# Patient Record
Sex: Female | Born: 1990 | Race: Black or African American | Hispanic: No | Marital: Single | Smoking: Former smoker
Health system: Southern US, Community
[De-identification: ages and names within clinical notes are randomized; demographics above are authoritative.]

## PROBLEM LIST (undated history)

## (undated) DIAGNOSIS — M25519 Pain in unspecified shoulder: Secondary | ICD-10-CM

## (undated) DIAGNOSIS — M549 Dorsalgia, unspecified: Secondary | ICD-10-CM

## (undated) DIAGNOSIS — G8929 Other chronic pain: Secondary | ICD-10-CM

## (undated) HISTORY — PX: CYST EXCISION: SHX5701

---
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
## 2012-01-12 ENCOUNTER — Encounter (HOSPITAL_COMMUNITY): Payer: Self-pay | Admitting: Emergency Medicine

## 2012-01-12 ENCOUNTER — Emergency Department (HOSPITAL_COMMUNITY)
Admission: EM | Admit: 2012-01-12 | Discharge: 2012-01-12 | Disposition: A | Discharge status: Home / Self Care | Payer: BC Managed Care – PPO | Attending: Emergency Medicine | Admitting: Emergency Medicine

## 2012-01-12 DIAGNOSIS — R51 Headache: Secondary | ICD-10-CM | POA: Insufficient documentation

## 2012-01-12 DIAGNOSIS — H53149 Visual discomfort, unspecified: Secondary | ICD-10-CM | POA: Insufficient documentation

## 2012-01-12 DIAGNOSIS — R42 Dizziness and giddiness: Secondary | ICD-10-CM | POA: Insufficient documentation

## 2012-01-12 LAB — URINALYSIS, ROUTINE W REFLEX MICROSCOPIC
Hgb urine dipstick: NEGATIVE
Nitrite: NEGATIVE
Specific Gravity, Urine: 1.02 (ref 1.005–1.030)
Urobilinogen, UA: 0.2 mg/dL (ref 0.0–1.0)
pH: 7 (ref 5.0–8.0)

## 2012-01-12 MED ORDER — IBUPROFEN 800 MG PO TABS
800.0000 mg | ORAL_TABLET | Freq: Once | ORAL | Status: AC
  Administered 2012-01-12: 800 mg via ORAL
  Filled 2012-01-12: qty 1

## 2012-01-12 MED ORDER — SUMATRIPTAN SUCCINATE 6 MG/0.5ML ~~LOC~~ SOLN
6.0000 mg | Freq: Once | SUBCUTANEOUS | Status: AC
  Administered 2012-01-12: 6 mg via SUBCUTANEOUS
  Filled 2012-01-12: qty 0.5

## 2012-01-12 MED ORDER — DIPHENHYDRAMINE HCL 25 MG PO CAPS
50.0000 mg | ORAL_CAPSULE | Freq: Once | ORAL | Status: AC
  Administered 2012-01-12: 50 mg via ORAL
  Filled 2012-01-12: qty 2

## 2012-01-12 MED ORDER — ISOMETHEPTENE-APAP-DICHLORAL 65-325-100 MG PO CAPS: 1.0000 | ORAL_CAPSULE | Freq: Four times a day (QID) | ORAL | Status: DC | PRN

## 2012-01-12 MED ORDER — METOCLOPRAMIDE HCL 10 MG PO TABS
10.0000 mg | ORAL_TABLET | Freq: Once | ORAL | Status: AC
  Administered 2012-01-12: 10 mg via ORAL
  Filled 2012-01-12: qty 1

## 2012-01-12 MED ORDER — PROCHLORPERAZINE EDISYLATE 5 MG/ML IJ SOLN
10.0000 mg | Freq: Once | INTRAMUSCULAR | Status: DC
  Filled 2012-01-12: qty 2
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
## 2012-01-12 NOTE — ED Notes (Signed)
Pt alert & oriented x4, stable gait. Pt given discharge instructions, paperwork & prescription(s). Patient instructed to stop at the registration desk to finish any additional paperwork. pt verbalized understanding. Pt left department w/ no further questions.  

## 2012-01-12 NOTE — Discharge Instructions (Signed)
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
## 2012-01-16 NOTE — ED Provider Notes (Signed)
Medical screening examination/treatment/procedure(s) were performed by non-physician practitioner and as supervising physician I was immediately available for consultation/collaboration.   Shelda Jakes, MD 01/16/12 [phone]

## 2012-04-24 ENCOUNTER — Encounter (HOSPITAL_COMMUNITY): Payer: Self-pay

## 2012-04-24 ENCOUNTER — Emergency Department (HOSPITAL_COMMUNITY)
Admission: EM | Admit: 2012-04-24 | Discharge: 2012-04-24 | Disposition: A | Discharge status: Home / Self Care | Payer: BC Managed Care – PPO | Attending: Emergency Medicine | Admitting: Emergency Medicine

## 2012-04-24 DIAGNOSIS — Z77098 Contact with and (suspected) exposure to other hazardous, chiefly nonmedicinal, chemicals: Secondary | ICD-10-CM | POA: Insufficient documentation

## 2012-04-24 DIAGNOSIS — H5789 Other specified disorders of eye and adnexa: Secondary | ICD-10-CM

## 2012-04-24 DIAGNOSIS — H5710 Ocular pain, unspecified eye: Secondary | ICD-10-CM | POA: Insufficient documentation

## 2012-04-24 NOTE — ED Notes (Signed)
Pt reports got some spray silicone in left eye approx ago.  Pt says rinsed her eye with water for approx 10 min.

## 2012-04-24 NOTE — ED Notes (Signed)
Pt's left eye is 20/25, right eye is 20/20

## 2012-04-24 NOTE — ED Provider Notes (Signed)
History     CSN: [id number]  Arrival date & time 04/24/12  1743   First MD Initiated Contact with Patient 04/24/12 1756      Chief Complaint  Patient presents with  . Eye Pain    (Consider location/radiation/quality/duration/timing/severity/associated sxs/prior treatment) HPI Comments: Patient comes to ED requesting evaluation of her left eye after she got OTC silicone spray in her left eye.  States that she flushed her eye for approx 10 minutes with water and states he eye is feeling much better, but states she "wanted to make sure it was okay".  States pain has improved but her eye now "feels dry".  She denies headaches, dizziness, or visual changes  Patient is a 21 y.o. female presenting with eye problem. The history is provided by the patient.  Eye Problem  This is a new problem. The current episode started less than 1 hour ago. The problem occurs constantly. The problem has been rapidly improving. There is pain in the left eye. The injury mechanism was a chemical exposure. The pain is mild. There is no history of trauma to the eye. There is no known exposure to pink eye. She does not wear contacts. Associated symptoms include eye redness. Pertinent negatives include no numbness, no blurred vision, no decreased vision, no discharge, no double vision, no foreign body sensation, no photophobia, no nausea, no vomiting, no tingling, no weakness and no itching. She has tried water for the symptoms. The treatment provided significant relief.    History reviewed. No pertinent past medical history.  History reviewed. No pertinent past surgical history.  No family history on file.  History  Substance Use Topics  . Smoking status: Never Smoker   . Smokeless tobacco: Not on file  . Alcohol Use: No    OB History    Grav Para Term Preterm Abortions TAB SAB Ect Mult Living                  Review of Systems  Constitutional: Negative for fever.  HENT: Negative for facial swelling.     Eyes: Positive for pain and redness. Negative for blurred vision, double vision, photophobia, discharge, itching and visual disturbance.  Gastrointestinal: Negative for nausea and vomiting.  Skin: Negative for itching.  Neurological: Negative for dizziness, tingling, weakness, light-headedness, numbness and headaches.  Psychiatric/Behavioral: Negative for confusion and decreased concentration.  All other systems reviewed and are negative.    Allergies  Review of patient's allergies indicates no known allergies.  Home Medications   Current Outpatient Rx  Name Route Sig Dispense Refill  . ISOMETHEPTENE-APAP-DICHLORAL 65-325-100 MG PO CAPS Oral Take 1 capsule by mouth 4 (four) times daily as needed. 30 capsule 0    BP 121/82  Pulse 75  Temp 98.8 F (37.1 C) (Oral)  Resp 18  Ht 5\' 6"  (1.676 m)  Wt 129 lb (58.514 kg)  BMI 20.82 kg/m2  SpO2 100%  LMP 04/10/2012  Physical Exam  Nursing note and vitals reviewed. Constitutional: She is oriented to person, place, and time. She appears well-developed and well-nourished. No distress.  HENT:  Head: Normocephalic and atraumatic.  Mouth/Throat: Oropharynx is clear and moist.  Eyes: EOM and lids are normal. Pupils are equal, round, and reactive to light. No foreign bodies found. Right eye exhibits no chemosis and no discharge. Left eye exhibits no chemosis and no discharge. Right conjunctiva is not injected. Right conjunctiva has no hemorrhage. Left conjunctiva is not injected. Left conjunctiva has no hemorrhage. Right eye exhibits  normal extraocular motion. Left eye exhibits normal extraocular motion.  Neck: Normal range of motion. Neck supple.  Cardiovascular: Normal rate, regular rhythm, normal heart sounds and intact distal pulses.   No murmur heard. Pulmonary/Chest: Effort normal and breath sounds normal. No respiratory distress.  Musculoskeletal: Normal range of motion.  Lymphadenopathy:    She has no cervical adenopathy.   Neurological: She is alert and oriented to person, place, and time. She exhibits normal muscle tone. Coordination normal.  Skin: Skin is warm and dry.    ED Course  Procedures (including critical care time)  Labs Reviewed - No data to display    Visual acuity reviewed by me.    MDM    Left eye was irrigated with 150 mL of nml saline.  Visual acuity is wnml.     Pt is feeling better.  MSDS sheet was reviewed by me.  Does not wear contacts.  Pt agrees to follow-up with Dr. Lita Mains.  Advised to use natural tears.    The patient appears reasonably screened and/or stabilized for discharge and I doubt any other medical condition or other [facility] requiring further screening, evaluation, or treatment in the ED at this time prior to discharge.   Bo Teicher L. [place], [place] 04/26/12 1946

## 2012-04-24 NOTE — ED Notes (Signed)
Flushed left eye with approx NSS.  Pt tolerated well.

## 2012-04-27 NOTE — ED Provider Notes (Signed)
Medical screening examination/treatment/procedure(s) were performed by non-physician practitioner and as supervising physician I was immediately available for consultation/collaboration.  Doug Sou, MD 04/27/12 [phone]

## 2012-06-12 ENCOUNTER — Encounter (HOSPITAL_COMMUNITY): Payer: Self-pay | Admitting: *Deleted

## 2012-06-12 ENCOUNTER — Emergency Department (HOSPITAL_COMMUNITY)
Admission: EM | Admit: 2012-06-12 | Discharge: 2012-06-12 | Disposition: A | Discharge status: Home / Self Care | Payer: BC Managed Care – PPO | Attending: Emergency Medicine | Admitting: Emergency Medicine

## 2012-06-12 ENCOUNTER — Emergency Department (HOSPITAL_COMMUNITY): Payer: BC Managed Care – PPO

## 2012-06-12 DIAGNOSIS — J069 Acute upper respiratory infection, unspecified: Secondary | ICD-10-CM | POA: Insufficient documentation

## 2012-06-12 LAB — URINALYSIS, ROUTINE W REFLEX MICROSCOPIC
Bilirubin Urine: NEGATIVE
Glucose, UA: NEGATIVE mg/dL
Hgb urine dipstick: NEGATIVE
Specific Gravity, Urine: 1.015 (ref 1.005–1.030)
Urobilinogen, UA: 1 mg/dL (ref 0.0–1.0)

## 2012-06-12 LAB — PREGNANCY, URINE: Preg Test, Ur: NEGATIVE

## 2012-06-12 MED ORDER — IBUPROFEN 800 MG PO TABS
800.0000 mg | ORAL_TABLET | Freq: Once | ORAL | Status: AC
  Administered 2012-06-12: 800 mg via ORAL
  Filled 2012-06-12: qty 1

## 2012-06-12 NOTE — ED Provider Notes (Signed)
History     CSN: [id number]  Arrival date & time 06/12/12  1530   First MD Initiated Contact with Patient 06/12/12 1852      No chief complaint on file.   (Consider location/radiation/quality/duration/timing/severity/associated sxs/prior treatment) HPI Comments: Patient presents with 3 days of body aches, headache, sore throat, nausea and vomiting and generalized weakness. She also has nonproductive cough and rhinorrhea. She has multiple sick contacts. Denies fever. No chest pain, shortness of breath, abdominal pain. She feels sore and achy all over and not noticed any rashes. Good by mouth intake and urine output.  The history is provided by the patient.    History reviewed. No pertinent past medical history.  History reviewed. No pertinent past surgical history.  History reviewed. No pertinent family history.  History  Substance Use Topics  . Smoking status: Never Smoker   . Smokeless tobacco: Not on file  . Alcohol Use: No    OB History    Grav Para Term Preterm Abortions TAB SAB Ect Mult Living                  Review of Systems  Constitutional: Positive for activity change, appetite change and fatigue. Negative for fever.  HENT: Positive for congestion, sore throat and rhinorrhea. Negative for neck pain and neck stiffness.   Eyes: Negative for visual disturbance.  Respiratory: Negative for cough and chest tightness.   Cardiovascular: Negative for chest pain.  Gastrointestinal: Positive for nausea. Negative for vomiting and abdominal pain.  Genitourinary: Negative for dysuria and hematuria.  Musculoskeletal: Positive for myalgias and arthralgias. Negative for back pain.  Skin: Negative for rash.  Neurological: Positive for weakness and headaches. Negative for dizziness.    Allergies  Review of patient's allergies indicates no known allergies.  Home Medications   Current Outpatient Rx  Name Route Sig Dispense Refill  . PSEUDOEPH-DOXYLAMINE-DM-APAP  60-7.02-01-999 MG/30ML PO LIQD Oral Take 15 mLs by mouth once as needed. For  Cold and flu symptoms      BP 119/69  Pulse 88  Temp 99.2 F (37.3 C) (Oral)  Resp 18  Ht 5\' 6"  (1.676 m)  Wt 127 lb (57.607 kg)  BMI 20.50 kg/m2  SpO2 100%  LMP 06/09/2012  Physical Exam  Constitutional: She is oriented to person, place, and time. She appears well-developed and well-nourished. No distress.       nontoxic  HENT:  Head: Normocephalic and atraumatic.  Mouth/Throat: Oropharynx is clear and moist. No oropharyngeal exudate.  Eyes: Conjunctivae normal and EOM are normal. Pupils are equal, round, and reactive to light.  Neck: Normal range of motion. Neck supple.       No meningismus  Cardiovascular: Normal rate, regular rhythm and normal heart sounds.   No murmur heard. Pulmonary/Chest: Effort normal and breath sounds normal. No respiratory distress.  Abdominal: Soft. Bowel sounds are normal. There is no tenderness. There is no rebound and no guarding.  Musculoskeletal: Normal range of motion. She exhibits no edema and no tenderness.  Neurological: She is alert and oriented to person, place, and time. No cranial nerve deficit.  Skin: Skin is warm.    ED Course  Procedures (including critical care time)  Labs Reviewed  URINALYSIS, ROUTINE W REFLEX MICROSCOPIC - Abnormal; Notable for the following:    APPearance HAZY (*)     All other components within normal limits  PREGNANCY, URINE  RAPID STREP SCREEN   Dg Chest 2 View  06/12/2012  *RADIOLOGY REPORT*  Clinical Data:  Cough  CHEST - 2 VIEW  Comparison: 05/19/2008.  Findings: The heart, mediastinal and hilar contours are normal. The lungs are clear and fully expanded.  There are no effusions or pneumothoraces.  There is mild dextroscoliosis.  The bony thorax is normal.  IMPRESSION: Normal chest.   Original Report Authenticated By: Mervin Hack, M.D.      1. Upper respiratory infection       MDM  Bodyaches, headache, sore  throat, congestion, nausea.  Vitals stable, afebrile, nontoxic.  CXR negative.  No meningismus, normal neuro exam.  Supportive care for URI.     Glynn Octave, MD 06/12/12 2124

## 2012-06-12 NOTE — ED Notes (Signed)
Sore throat, vomiting, headache, weakness.

## 2012-06-12 NOTE — ED Notes (Addendum)
Pt reporting generalized aches and morning nausea x2-3 days.  States she vomits "just a little".  Also reporting headache and mild cough. No distress noted at present time.

## 2012-07-08 ENCOUNTER — Emergency Department (HOSPITAL_COMMUNITY)
Admission: EM | Admit: 2012-07-08 | Discharge: 2012-07-08 | Disposition: A | Discharge status: Home / Self Care | Payer: BC Managed Care – PPO | Attending: Emergency Medicine | Admitting: Emergency Medicine

## 2012-07-08 ENCOUNTER — Encounter (HOSPITAL_COMMUNITY): Payer: Self-pay | Admitting: *Deleted

## 2012-07-08 DIAGNOSIS — M549 Dorsalgia, unspecified: Secondary | ICD-10-CM | POA: Insufficient documentation

## 2012-07-08 DIAGNOSIS — G8929 Other chronic pain: Secondary | ICD-10-CM | POA: Insufficient documentation

## 2012-07-08 HISTORY — DX: Pain in unspecified shoulder: M25.519

## 2012-07-08 HISTORY — DX: Other chronic pain: G89.29

## 2012-07-08 HISTORY — DX: Dorsalgia, unspecified: M54.9

## 2012-07-08 MED ORDER — TRAMADOL HCL 50 MG PO TABS: 50.0000 mg | ORAL_TABLET | Freq: Four times a day (QID) | ORAL | Status: DC | PRN

## 2012-07-08 MED ORDER — CYCLOBENZAPRINE HCL 10 MG PO TABS
10.0000 mg | ORAL_TABLET | Freq: Once | ORAL | Status: AC
  Administered 2012-07-08: 10 mg via ORAL
  Filled 2012-07-08: qty 1

## 2012-07-08 MED ORDER — TRAMADOL HCL 50 MG PO TABS
50.0000 mg | ORAL_TABLET | Freq: Once | ORAL | Status: AC
  Administered 2012-07-08: 50 mg via ORAL
  Filled 2012-07-08: qty 1
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
## 2012-07-08 NOTE — ED Notes (Signed)
Pt discharged. Pt stable at time of discharge. Medications reviewed pt has no questions regarding discharge at this time. Pt voiced understanding of discharge instructions.  
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
## 2012-09-25 ENCOUNTER — Encounter (HOSPITAL_COMMUNITY): Payer: Self-pay

## 2012-09-25 ENCOUNTER — Emergency Department (HOSPITAL_COMMUNITY): Payer: BC Managed Care – PPO

## 2012-09-25 ENCOUNTER — Emergency Department (HOSPITAL_COMMUNITY)
Admission: EM | Admit: 2012-09-25 | Discharge: 2012-09-25 | Disposition: A | Discharge status: Home / Self Care | Payer: BC Managed Care – PPO | Attending: Emergency Medicine | Admitting: Emergency Medicine

## 2012-09-25 DIAGNOSIS — R0789 Other chest pain: Secondary | ICD-10-CM | POA: Insufficient documentation

## 2012-09-25 DIAGNOSIS — Z8679 Personal history of other diseases of the circulatory system: Secondary | ICD-10-CM | POA: Insufficient documentation

## 2012-09-25 DIAGNOSIS — G8929 Other chronic pain: Secondary | ICD-10-CM | POA: Insufficient documentation

## 2012-09-25 DIAGNOSIS — F17200 Nicotine dependence, unspecified, uncomplicated: Secondary | ICD-10-CM | POA: Insufficient documentation

## 2012-09-25 MED ORDER — IBUPROFEN 800 MG PO TABS: 800.0000 mg | ORAL_TABLET | Freq: Three times a day (TID) | ORAL | Status: DC

## 2012-09-25 MED ORDER — IBUPROFEN 800 MG PO TABS
800.0000 mg | ORAL_TABLET | Freq: Once | ORAL | Status: AC
  Administered 2012-09-25: 800 mg via ORAL
  Filled 2012-09-25: qty 1

## 2012-09-25 NOTE — ED Notes (Signed)
Sharp stabbing pains in center of chest that started several days ago, intermittent, lasts a minute or so, coming every 20-30 minutes today, no assoc symptoms

## 2012-09-25 NOTE — ED Notes (Signed)
Discharge instructions given and reviewed with patient.  Prescription given for Motrin; effects and use explained.  Patient verbalized understanding to take Motrin as directed and to follow up with PMD as needed.  Patient ambulatory; discharged home in good condition.
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
## 2013-04-04 ENCOUNTER — Emergency Department (HOSPITAL_COMMUNITY)
Admission: EM | Admit: 2013-04-04 | Discharge: 2013-04-04 | Disposition: A | Discharge status: Home / Self Care | Payer: BC Managed Care – PPO | Attending: Emergency Medicine | Admitting: Emergency Medicine

## 2013-04-04 ENCOUNTER — Encounter (HOSPITAL_COMMUNITY): Payer: Self-pay

## 2013-04-04 DIAGNOSIS — M26609 Unspecified temporomandibular joint disorder, unspecified side: Secondary | ICD-10-CM | POA: Insufficient documentation

## 2013-04-04 DIAGNOSIS — F17200 Nicotine dependence, unspecified, uncomplicated: Secondary | ICD-10-CM | POA: Insufficient documentation

## 2013-04-04 DIAGNOSIS — G8929 Other chronic pain: Secondary | ICD-10-CM | POA: Insufficient documentation

## 2013-04-04 MED ORDER — NAPROXEN 500 MG PO TABS: 500.0000 mg | ORAL_TABLET | Freq: Two times a day (BID) | ORAL | Status: DC

## 2013-04-04 NOTE — ED Notes (Signed)
Pt c/o pain in R jaw x 3 days.  Reports pain is worse with movement.  Reports jaw making a "popping" noise.

## 2013-04-04 NOTE — ED Provider Notes (Signed)
CSN: [id number]     Arrival date & time 04/04/13  0718 History    This chart was scribed for Charles B. Bernette Mayers, MD by Leone Payor, ED Scribe. This patient was seen in room APA11/APA11 and the patient's care was started 8:02 AM.   First MD Initiated Contact with Patient 04/04/13 0800     Chief Complaint  Patient presents with  . Jaw Pain    The history is provided by the patient. No language interpreter was used.    HPI Comments: Audrey Reid is a 22 y.o. female who presents to the Emergency Department complaining of ongoing, constant right jaw pain starting 3 days ago. Pt states pain is worse with movement. Reports the jaw makes a popping noise. Pt denies frequent gum chewing. Denies fever, cough.   Past Medical History  Diagnosis Date  . Chronic shoulder pain   . Chronic back pain    History reviewed. No pertinent past surgical history. No family history on file. History  Substance Use Topics  . Smoking status: Current Every Day Smoker  . Smokeless tobacco: Not on file  . Alcohol Use: No   OB History   Grav Para Term Preterm Abortions TAB SAB Ect Mult Living                 Review of Systems  Constitutional: Negative for fever.  HENT: Negative for dental problem.        Jaw pain  Respiratory: Negative for cough.     Allergies  Review of patient's allergies indicates no known allergies.  Home Medications   Current Outpatient Rx  Name  Route  Sig  Dispense  Refill  . ibuprofen (ADVIL,MOTRIN) 800 MG tablet   Oral   Take 1 tablet (800 mg total) by mouth 3 (three) times daily.   21 tablet   0   . Pseudoeph-Doxylamine-DM-APAP (NYQUIL) 60-7.02-01-999 MG/30ML LIQD   Oral   Take 15 mLs by mouth once as needed. For  Cold and flu symptoms         . traMADol (ULTRAM) 50 MG tablet   Oral   Take 1 tablet (50 mg total) by mouth every 6 (six) hours as needed for pain.   15 tablet   0    BP 130/77  Pulse 60  Temp(Src) 98.4 F (36.9 C) (Oral)  Resp 18  Ht  5\' 6"  (1.676 m)  Wt 135 lb (61.236 kg)  BMI 21.8 kg/m2  SpO2 100%  LMP 03/28/2013 Physical Exam  Nursing note and vitals reviewed. Constitutional: She is oriented to person, place, and time. She appears well-developed and well-nourished.  HENT:  Head: Normocephalic and atraumatic.  Tender right TMJ.   Eyes: EOM are normal. Pupils are equal, round, and reactive to light.  Neck: Normal range of motion. Neck supple.  Cardiovascular: Normal rate, normal heart sounds and intact distal pulses.   Pulmonary/Chest: Effort normal and breath sounds normal.  Abdominal: Bowel sounds are normal. She exhibits no distension. There is no tenderness.  Musculoskeletal: Normal range of motion. She exhibits no edema and no tenderness.  Neurological: She is alert and oriented to person, place, and time. She has normal strength. No cranial nerve deficit or sensory deficit.  Skin: Skin is warm and dry. No rash noted.  Psychiatric: She has a normal mood and affect.    ED Course   Procedures (including critical care time)  DIAGNOSTIC STUDIES: Oxygen Saturation is 100% on RA, normal by my interpretation.  COORDINATION OF CARE: 8:01 AM Discussed treatment plan with pt at bedside and pt agreed to plan.   Labs Reviewed - No data to display No results found. 1. TMJ (temporomandibular joint syndrome)     MDM  Pt with benign uncomplicated TMJ syndrome. Advised NSAIDs, soft diet and PCP followup for recheck if worsening.   I personally performed the services described in this documentation, which was scribed in my presence. The recorded information has been reviewed and is accurate.     Charles B. Bernette Mayers, MD 04/04/13 1322

## 2014-05-13 ENCOUNTER — Encounter (HOSPITAL_COMMUNITY): Payer: Self-pay | Admitting: Emergency Medicine

## 2014-05-13 ENCOUNTER — Emergency Department (HOSPITAL_COMMUNITY)
Admission: EM | Admit: 2014-05-13 | Discharge: 2014-05-13 | Disposition: A | Discharge status: Home / Self Care | Payer: BC Managed Care – PPO | Attending: Emergency Medicine | Admitting: Emergency Medicine

## 2014-05-13 DIAGNOSIS — Z87891 Personal history of nicotine dependence: Secondary | ICD-10-CM | POA: Insufficient documentation

## 2014-05-13 DIAGNOSIS — G8929 Other chronic pain: Secondary | ICD-10-CM | POA: Diagnosis not present

## 2014-05-13 DIAGNOSIS — J029 Acute pharyngitis, unspecified: Secondary | ICD-10-CM | POA: Diagnosis present

## 2014-05-13 LAB — RAPID STREP SCREEN (MED CTR MEBANE ONLY): Streptococcus, Group A Screen (Direct): NEGATIVE

## 2014-05-13 NOTE — ED Provider Notes (Signed)
CSN: [id number]     Arrival date & time 05/13/14  1613 History   First MD Initiated Contact with Patient 05/13/14 1628     Chief Complaint  Patient presents with  . Sore Throat     (Consider location/radiation/quality/duration/timing/severity/associated sxs/prior Treatment) Patient is a 23 y.o. female presenting with pharyngitis. The history is provided by the patient.  Sore Throat This is a new problem. The current episode started in the past 7 days. The problem occurs constantly. The problem has been gradually worsening. The symptoms are aggravated by swallowing. She has tried nothing for the symptoms.   Audrey Reid is a 23 y.o. female who presents to the ED with sore throat. She denies fever or chills. The patient states that she has been having oral sex with several different partners and they have had sex with other people as well. She is concerned that she may have an STI. She request testing for STI's. Patient has had oral and vaginal sex.   Past Medical History  Diagnosis Date  . Chronic shoulder pain   . Chronic back pain    History reviewed. No pertinent past surgical history. History reviewed. No pertinent family history. History  Substance Use Topics  . Smoking status: Former Smoker    Quit date: 05/13/2013  . Smokeless tobacco: Not on file  . Alcohol Use: No   OB History   Grav Para Term Preterm Abortions TAB SAB Ect Mult Living                 Review of Systems  negative except as stated in HPI    Allergies  Review of patient's allergies indicates no known allergies.  Home Medications   Prior to Admission medications   Not on File   BP 131/86  Pulse 74  Temp(Src) 100.4 F (38 C) (Oral)  Resp 18  Ht  (1.676 m)  Wt 153 lb (69.4 kg)  BMI 24.71 kg/m2  SpO2 100%  LMP 04/27/2014 Physical Exam  Nursing note and vitals reviewed. Constitutional: She is oriented to person, place, and time. She appears well-developed and well-nourished. No  distress.  HENT:  Head: Normocephalic and atraumatic.  Mouth/Throat: Uvula is midline and mucous membranes are normal. Posterior oropharyngeal erythema present.  Eyes: Conjunctivae and EOM are normal.  Neck: Normal range of motion. Neck supple.  Cardiovascular: Normal rate and regular rhythm.   Pulmonary/Chest: Effort normal and breath sounds normal.  Musculoskeletal: Normal range of motion.  Lymphadenopathy:    She has cervical adenopathy (mild left).  Neurological: She is alert and oriented to person, place, and time. No cranial nerve deficit.  Skin: Skin is warm and dry.  Psychiatric: She has a normal mood and affect. Her behavior is normal.    ED Course  Procedures Labs Review Rapid strep negataive Culture for GC from throat pending Syphilis and HIV pending.  MDM  23 y.o. female with sore throat x one week. Stable for discharge with negative strep screen. Discussed with the patient cultures pending and blood work pending. Encouraged patient to follow up with the health department for further screening. She agrees with plan.      [facility] Orlene Och, [place] 05/13/14 [phone]

## 2014-05-13 NOTE — ED Notes (Signed)
Pt was called and given results on rapid strep test.

## 2014-05-13 NOTE — ED Notes (Signed)
Sore throat x 1 week. Denies rash/fever/n/v. Nad.

## 2014-05-13 NOTE — ED Provider Notes (Signed)
Medical screening examination/treatment/procedure(s) were performed by non-physician practitioner and as supervising physician I was immediately available for consultation/collaboration.     Emmilyn Crooke, MD 05/13/14 1850 

## 2014-05-14 LAB — RPR

## 2014-05-14 LAB — HIV ANTIBODY (ROUTINE TESTING W REFLEX): HIV: NONREACTIVE

## 2014-05-15 LAB — CULTURE, GROUP A STREP

## 2014-05-16 LAB — GONOCOCCUS CULTURE

## 2014-06-04 ENCOUNTER — Encounter (HOSPITAL_COMMUNITY): Payer: Self-pay | Admitting: Emergency Medicine

## 2014-06-04 ENCOUNTER — Emergency Department (HOSPITAL_COMMUNITY)
Admission: EM | Admit: 2014-06-04 | Discharge: 2014-06-04 | Disposition: A | Discharge status: Home / Self Care | Payer: BC Managed Care – PPO | Attending: Emergency Medicine | Admitting: Emergency Medicine

## 2014-06-04 DIAGNOSIS — G8929 Other chronic pain: Secondary | ICD-10-CM | POA: Insufficient documentation

## 2014-06-04 DIAGNOSIS — J029 Acute pharyngitis, unspecified: Secondary | ICD-10-CM | POA: Insufficient documentation

## 2014-06-04 DIAGNOSIS — J020 Streptococcal pharyngitis: Secondary | ICD-10-CM

## 2014-06-04 DIAGNOSIS — Z87891 Personal history of nicotine dependence: Secondary | ICD-10-CM | POA: Diagnosis not present

## 2014-06-04 LAB — RAPID STREP SCREEN (MED CTR MEBANE ONLY): Streptococcus, Group A Screen (Direct): POSITIVE — AB

## 2014-06-04 MED ORDER — DEXAMETHASONE 1 MG/ML PO CONC: 10.0000 mg | Freq: Once | ORAL | Status: DC

## 2014-06-04 MED ORDER — LIDOCAINE VISCOUS 2 % MT SOLN
15.0000 mL | Freq: Once | OROMUCOSAL | Status: AC
  Administered 2014-06-04: 15 mL via OROMUCOSAL
  Filled 2014-06-04: qty 15

## 2014-06-04 MED ORDER — KETOROLAC TROMETHAMINE 30 MG/ML IJ SOLN
30.0000 mg | Freq: Once | INTRAMUSCULAR | Status: AC
  Administered 2014-06-04: 30 mg via INTRAMUSCULAR
  Filled 2014-06-04: qty 1

## 2014-06-04 MED ORDER — LIDOCAINE VISCOUS 2 % MT SOLN: 15.0000 mL | OROMUCOSAL | Status: DC | PRN

## 2014-06-04 MED ORDER — DEXAMETHASONE 10 MG/ML FOR PEDIATRIC ORAL USE
INTRAMUSCULAR | Status: AC
  Administered 2014-06-04: 10 mg via ORAL
  Filled 2014-06-04: qty 1

## 2014-06-04 MED ORDER — PENICILLIN G BENZATHINE 1200000 UNIT/2ML IM SUSP
1.2000 10*6.[IU] | Freq: Once | INTRAMUSCULAR | Status: AC
  Administered 2014-06-04: 1.2 10*6.[IU] via INTRAMUSCULAR
  Filled 2014-06-04: qty 2

## 2014-06-04 NOTE — ED Provider Notes (Signed)
CSN: [id number]     Arrival date & time 06/04/14  0715 History  This chart was scribed for Audrey Munch, MD by Jolene Provost, ED Scribe. This patient was seen in room APA19/APA19 and the patient's care was started at 7:32 AM.    Chief Complaint  Patient presents with  . Sore Throat   The history is provided by the patient. No language interpreter was used.   HPI Comments: COHEN DOLEMAN is a 23 y.o. female presents to the Emergency Department complaining of sore throat for the last three days. Pt states that it hurts to swallow, and that "her ears have been popping." Pt states that she has been coughing up sputum with specks of blood in it. Pt endorses subjective fever and chills. Pt has a past hx of ER visit, two months ago for similar symptoms. Pt received diagnostic tests that were all negative Pt used theraflu, cough drops, throat spray without relief. Pt deneis SOB, vomiting, chest pain, rashes, confusion, and hearing loss. Pt denies hx of chronic conditions.  Past Medical History  Diagnosis Date  . Chronic shoulder pain   . Chronic back pain    History reviewed. No pertinent past surgical history. No family history on file. History  Substance Use Topics  . Smoking status: Former Smoker    Quit date: 05/13/2013  . Smokeless tobacco: Not on file  . Alcohol Use: No   OB History   Grav Para Term Preterm Abortions TAB SAB Ect Mult Living                 Review of Systems  Constitutional:       Per HPI, otherwise negative  HENT:       Per HPI, otherwise negative  Respiratory:       Per HPI, otherwise negative  Cardiovascular:       Per HPI, otherwise negative  Gastrointestinal: Negative for vomiting.  Endocrine:       Negative aside from HPI  Genitourinary:       Neg aside from HPI   Musculoskeletal:       Per HPI, otherwise negative  Skin: Negative.   Neurological: Negative for syncope.      Allergies  Review of patient's allergies indicates no known  allergies.  Home Medications   Prior to Admission medications   Not on File   BP 127/98  Pulse 108  Temp(Src) 99.9 F (37.7 C)  Resp 18  SpO2 100%  LMP 05/29/2014 Physical Exam  Nursing note and vitals reviewed. Constitutional: She is oriented to person, place, and time. She appears well-developed and well-nourished. No distress.  HENT:  Head: Normocephalic and atraumatic.  Right Ear: Tympanic membrane and ear canal normal.  Left Ear: Tympanic membrane and ear canal normal.  Mouth/Throat: Uvula is midline and mucous membranes are normal. No uvula swelling. Posterior oropharyngeal erythema present. No posterior oropharyngeal edema or tonsillar abscesses.  Ears clear.  Eyes: Conjunctivae and EOM are normal.  Cardiovascular: Normal rate and regular rhythm.   Pulmonary/Chest: Effort normal and breath sounds normal. No stridor. No respiratory distress. She has no wheezes. She has no rales.  Abdominal: She exhibits no distension.  Musculoskeletal: She exhibits no edema.  Lymphadenopathy:    She has no cervical adenopathy.  Neurological: She is alert and oriented to person, place, and time. No cranial nerve deficit.  Skin: Skin is warm and dry.  Psychiatric: She has a normal mood and affect.    ED Course  Procedures (including critical care time)  DIAGNOSTIC STUDIES: Oxygen Saturation is 100% on room air, normal by my interpretation.    COORDINATION OF CARE: 7:35 AM Will order rapid strep test.   Labs Review Labs Reviewed  RAPID STREP SCREEN - Abnormal; Notable for the following:    Streptococcus, Group A Screen (Direct) POSITIVE (*)    All other components within normal limits    A review of the patient's chart demonstrates a visit earlier this month for similar concerns.  At that point patient had a negative strep test. Followup testing including HIV, RPR, gonorrhea, all negative.   8:24 AM Patient in no distress. Patient has no primary care physician, will be  provided a local resources to obtain followup care.   MDM   I personally performed the services described in this documentation, which was scribed in my presence. The recorded information has been reviewed and is accurate.   Patient presents with 3 days of throat, no evidence for distress on exam, no posterior oral pharyngeal edema, or imminent systemic compromise. Patient was treated with intramuscular penicillin for her strep pharyngitis, had improvement in her condition, was discharged in stable condition.   Audrey Munchobert Lilliann Rossetti, MD 06/04/14 [id number]

## 2014-06-04 NOTE — Discharge Instructions (Signed)

## 2014-06-04 NOTE — ED Notes (Signed)
Sore throat x 3 days. Pt also reports chills.

## 2014-08-31 ENCOUNTER — Emergency Department (HOSPITAL_COMMUNITY)
Admission: EM | Admit: 2014-08-31 | Discharge: 2014-08-31 | Discharge status: Against Medical Advice | Payer: BC Managed Care – PPO | Attending: Emergency Medicine | Admitting: Emergency Medicine

## 2014-08-31 ENCOUNTER — Encounter (HOSPITAL_COMMUNITY): Payer: Self-pay | Admitting: Emergency Medicine

## 2014-08-31 DIAGNOSIS — G8929 Other chronic pain: Secondary | ICD-10-CM | POA: Insufficient documentation

## 2014-08-31 DIAGNOSIS — R531 Weakness: Secondary | ICD-10-CM | POA: Diagnosis not present

## 2014-08-31 DIAGNOSIS — J029 Acute pharyngitis, unspecified: Secondary | ICD-10-CM | POA: Insufficient documentation

## 2014-08-31 NOTE — ED Notes (Signed)
Patient states sore throat, nasal drainage, and generalized body weakness X1 day.

## 2014-08-31 NOTE — ED Notes (Signed)
Patient states wait time is too long, and she needs to leave. Patient states she will try and get in with her PCP tomorrow. Patient states she does not want to stay and be seen. AMA form signed at this time

## 2014-08-31 NOTE — ED Notes (Signed)
Patient complains of sore throat, cough, headache, and fatigue x 3 days.

## 2014-09-24 ENCOUNTER — Encounter: Payer: 59 | Admitting: Women's Health

## 2014-10-02 ENCOUNTER — Encounter: Payer: 59 | Admitting: Advanced Practice Midwife

## 2014-11-08 ENCOUNTER — Encounter (HOSPITAL_COMMUNITY): Payer: Self-pay | Admitting: Emergency Medicine

## 2014-11-08 ENCOUNTER — Emergency Department (HOSPITAL_COMMUNITY)
Admission: EM | Admit: 2014-11-08 | Discharge: 2014-11-08 | Disposition: A | Discharge status: Home / Self Care | Payer: 59 | Attending: Emergency Medicine | Admitting: Emergency Medicine

## 2014-11-08 ENCOUNTER — Emergency Department (HOSPITAL_COMMUNITY): Payer: 59

## 2014-11-08 DIAGNOSIS — Z87891 Personal history of nicotine dependence: Secondary | ICD-10-CM | POA: Diagnosis not present

## 2014-11-08 DIAGNOSIS — S0990XA Unspecified injury of head, initial encounter: Secondary | ICD-10-CM | POA: Insufficient documentation

## 2014-11-08 DIAGNOSIS — G8929 Other chronic pain: Secondary | ICD-10-CM | POA: Insufficient documentation

## 2014-11-08 DIAGNOSIS — W228XXA Striking against or struck by other objects, initial encounter: Secondary | ICD-10-CM | POA: Insufficient documentation

## 2014-11-08 DIAGNOSIS — S199XXA Unspecified injury of neck, initial encounter: Secondary | ICD-10-CM | POA: Insufficient documentation

## 2014-11-08 DIAGNOSIS — Z79899 Other long term (current) drug therapy: Secondary | ICD-10-CM | POA: Insufficient documentation

## 2014-11-08 DIAGNOSIS — S39012A Strain of muscle, fascia and tendon of lower back, initial encounter: Secondary | ICD-10-CM | POA: Diagnosis not present

## 2014-11-08 DIAGNOSIS — Y9289 Other specified places as the place of occurrence of the external cause: Secondary | ICD-10-CM | POA: Insufficient documentation

## 2014-11-08 DIAGNOSIS — Z3202 Encounter for pregnancy test, result negative: Secondary | ICD-10-CM | POA: Insufficient documentation

## 2014-11-08 DIAGNOSIS — Y990 Civilian activity done for income or pay: Secondary | ICD-10-CM | POA: Diagnosis not present

## 2014-11-08 DIAGNOSIS — Y9389 Activity, other specified: Secondary | ICD-10-CM | POA: Insufficient documentation

## 2014-11-08 LAB — POC URINE PREG, ED: PREG TEST UR: NEGATIVE

## 2014-11-08 MED ORDER — IBUPROFEN 800 MG PO TABS
800.0000 mg | ORAL_TABLET | Freq: Once | ORAL | Status: AC
  Administered 2014-11-08: 800 mg via ORAL
  Filled 2014-11-08: qty 1

## 2014-11-08 MED ORDER — ACETAMINOPHEN 500 MG PO TABS
1000.0000 mg | ORAL_TABLET | Freq: Once | ORAL | Status: AC
Start: 2014-11-08 — End: 2014-11-08
  Administered 2014-11-08: 1000 mg via ORAL
  Filled 2014-11-08: qty 2

## 2014-11-08 NOTE — ED Provider Notes (Signed)
CSN: [id number]     Arrival date & time 11/08/14  0821 History   First MD Initiated Contact with Patient 11/08/14 [id number]     Chief Complaint  Patient presents with  . Headache     (Consider location/radiation/quality/duration/timing/severity/associated sxs/prior Treatment) The history is provided by the patient.   Audrey Reid is a 24 y.o. female presenting for evaluation of persistent headache , neck and back pain since striking her head yesterday morning at work.  She describes bending over to pick up a bundle of plastic and stood up, striking the top of her head, causing pain to radiate through her neck and entire spine. She is tender on the top of her scalp but denies having swelling.  She denies loc but reports briefly seeing "stars" immediately after then event.  She has had worsening pain and stiffness in her neck and entire back since the injury and reports she slept a lot yesterday, her day off.  She denies dizziness, nausea, vomiting, focal weakness, numbness or tingling in her extremities and has had no visual changes.  She has taken no medicines for her headache or back pain and has found no alleviators.     Past Medical History  Diagnosis Date  . Chronic shoulder pain   . Chronic back pain    History reviewed. No pertinent past surgical history. History reviewed. No pertinent family history. History  Substance Use Topics  . Smoking status: Former Smoker -- 0.01 packs/day for 2 years    Types: Cigarettes    Quit date: 05/13/2013  . Smokeless tobacco: Never Used  . Alcohol Use: No   OB History    Gravida Para Term Preterm AB TAB SAB Ectopic Multiple Living            0     Review of Systems  Constitutional: Negative for fever.  HENT: Negative for congestion and sore throat.   Eyes: Negative.  Negative for visual disturbance.  Respiratory: Negative for chest tightness and shortness of breath.   Cardiovascular: Negative for chest pain.  Gastrointestinal: Negative  for nausea and abdominal pain.  Genitourinary: Negative.   Musculoskeletal: Positive for back pain and neck pain. Negative for joint swelling and arthralgias.  Skin: Negative.  Negative for rash and wound.  Neurological: Positive for headaches. Negative for dizziness, weakness, light-headedness and numbness.  Psychiatric/Behavioral: Negative.       Allergies  Review of patient's allergies indicates no known allergies.  Home Medications   Prior to Admission medications   Medication Sig Start Date End Date Taking? Authorizing Provider  lidocaine (XYLOCAINE) 2 % solution Use as directed 15 mLs in the mouth or throat every 4 (four) hours as needed for mouth pain. Patient not taking: Reported on 11/08/2014 06/04/14   Gerhard Munchobert Lockwood, MD   BP 108/83 mmHg  Pulse 65  Temp(Src) 98.3 F (36.8 C) (Oral)  Resp 18  Ht 5\' 6"  (1.676 m)  Wt 152 lb (68.947 kg)  BMI 24.55 kg/m2  SpO2 100%  LMP 10/25/2014 Physical Exam  Constitutional: She appears well-developed and well-nourished.  HENT:  Head: Normocephalic. Head is without raccoon's eyes, without Battle's sign, without abrasion and without contusion.  Tender parietal scalp without edema, hematoma or palpable deformity.  Eyes: Conjunctivae and EOM are normal. Pupils are equal, round, and reactive to light.  Neck: Normal range of motion. Neck supple. Spinous process tenderness and muscular tenderness present.  Cardiovascular: Normal rate and intact distal pulses.   Pedal pulses normal.  Pulmonary/Chest:  Effort normal.  Abdominal: Soft. Bowel sounds are normal. She exhibits no distension and no mass.  Musculoskeletal: Normal range of motion. She exhibits no edema.       Thoracic back: She exhibits bony tenderness. She exhibits no swelling, no deformity and no spasm.       Lumbar back: She exhibits tenderness. She exhibits no swelling, no edema and no spasm.  Neurological: She is alert. She has normal strength. She displays no atrophy and no  tremor. No cranial nerve deficit or sensory deficit. Gait normal. GCS eye subscore is 4. GCS verbal subscore is 5. GCS motor subscore is 6.  No strength deficit noted in hip and knee flexor and extensor muscle groups.  Ankle flexion and extension intact. Equal grip strength. No pronator drift.  Skin: Skin is warm and dry.  Psychiatric: She has a normal mood and affect.  Nursing note and vitals reviewed.   ED Course  Procedures (including critical care time) Labs Review Labs Reviewed  POC URINE PREG, ED    Imaging Review Dg Cervical Spine Complete  11/08/2014   CLINICAL DATA:  24 year old female with a history of industrial accident. Cervical neck pain.  EXAM: CERVICAL SPINE  4+ VIEWS  COMPARISON:  08/07/2011  FINDINGS: Cervical Spine:  Cervical elements from the level of the C1-T1 maintain alignment, without subluxation, anterolisthesis, retrolisthesis.  No acute fracture line identified.  Unremarkable appearance of the craniocervical junction.  Vertebral body heights maintained as well as disc space heights.  No significant degenerative disc disease or endplate changes.  No significant facet disease.  Lateral atlantodental distance is symmetric. Alignment of the lateral mass of C1 and C2 aligned.  Prevertebral soft tissues within normal limits.  IMPRESSION: No radiographic evidence of acute fracture or malalignment of the cervical spine.  Signed,  Yvone Neu. Loreta Ave, DO  [facility]  [facility]   Electronically Signed   By: Gilmer Mor D.O.   On: 11/08/2014 11:47   Dg Thoracic Spine 2 View  11/08/2014   CLINICAL DATA:  24 year old female with a history of industrial accident. Thoracic back pain.  EXAM: THORACIC SPINE - 2 VIEW  COMPARISON:  None.  FINDINGS: Thoracic Spine:  Thoracic vertebral elements maintain normal anatomic alignment, with no evidence of anterolisthesis, retrolisthesis, or subluxation.  S-shaped scoliotic curvature of the  thoracolumbar junction. There is apex right curvature centered at T8-T9, with compensatory levoscoliosis centered at L3.  No acute fracture line identified.  Vertebral body heights maintained.  No significant endplate changes or facet disease.  Unremarkable appearance of the visualized thorax.  IMPRESSION: No radiographic evidence of acute fracture or malalignment of the thoracic spine.  Signed,  Yvone Neu. Loreta Ave, DO  [facility]  [facility]   Electronically Signed   By: Gilmer Mor D.O.   On: 11/08/2014 11:53   Dg Lumbar Spine Complete  11/08/2014   CLINICAL DATA:  24 year old female with a history of industrial accident. Lumbar back pain.  EXAM: LUMBAR SPINE - COMPLETE 4+ VIEW  COMPARISON:  None.  FINDINGS: Lumbar Spine:  Lumbar vertebral elements maintain normal alignment without evidence of anterolisthesis, retrolisthesis, subluxation.  No fracture line identified. Vertebral body heights maintained as well as disc space heights.  No significant degenerative disc disease or endplate changes. No significant facet changes.  Unremarkable appearance of the visualized abdomen.  No displaced pars defects on the oblique views.  IMPRESSION: No radiographic evidence of acute fracture or malalignment of the lumbar  spine  Signed,  Yvone Neu. Loreta Ave, DO  [facility]  [facility]   Electronically Signed   By: Gilmer Mor D.O.   On: 11/08/2014 11:50     EKG Interpretation None      MDM   Final diagnoses:  Back strain, initial encounter  Minor head injury without loss of consciousness, initial encounter    Minor head injury with possible mild concussion, more than 24 hours from time of injury without any worsened sx. Suspect possible mild concussion given drowsiness yesterday, no neurologic deficits to suggest intracranial bleeding.  Explained to patient signs/sx and possible residual sx which should resolve, but  advised recheck if not improving over 1 week.    Patients labs and/or radiological studies were reviewed and considered during the medical decision making and disposition process.  Results were also discussed with patient. No fx/dislocations spine.  She was given ibuprofen with improvement in back and neck sx, still with headache.  Tylenol 1 gram given, headache improved from 10 to 5.  Pt encouraged to continue taking ibuprofen and /or tylenol for home use.  Rest,  Avoid activity that worsens sx, activity at risk for new head injury until sx resolved.  Burgess Amor, PA-C 11/09/14 2140  Geoffery Lyons, MD 11/10/14 [phone]

## 2014-11-08 NOTE — ED Notes (Signed)
Patient C/O headache, neck pain, and mid back pain. Per patient while working yesterday bent over to pick something up and when she stood up she hit head on convair belt. Per patient no LOC but states "I was seeing stars for a minute." Patient reports going home and going to sleep with no relief in pain. Denies any nausea, vomiting, or changes in vision.

## 2014-11-08 NOTE — Discharge Instructions (Signed)
Back Pain, Adult Low back pain is very common. About 1 in 5 people have back pain.The cause of low back pain is rarely dangerous. The pain often gets better over time.About half of people with a sudden onset of back pain feel better in just 2 weeks. About 8 in 10 people feel better by 6 weeks.  CAUSES Some common causes of back pain include:  Strain of the muscles or ligaments supporting the spine.  Wear and tear (degeneration) of the spinal discs.  Arthritis.  Direct injury to the back. DIAGNOSIS Most of the time, the direct cause of low back pain is not known.However, back pain can be treated effectively even when the exact cause of the pain is unknown.Answering your caregiver's questions about your overall health and symptoms is one of the most accurate ways to make sure the cause of your pain is not dangerous. If your caregiver needs more information, he or she may order lab work or imaging tests (X-rays or MRIs).However, even if imaging tests show changes in your back, this usually does not require surgery. HOME CARE INSTRUCTIONS For many people, back pain returns.Since low back pain is rarely dangerous, it is often a condition that people can learn to [facility] their own.   Remain active. It is stressful on the back to sit or stand in one place. Do not sit, drive, or stand in one place for more than 30 minutes at a time. Take short walks on level surfaces as soon as pain allows.Try to increase the length of time you walk each day.  Do not stay in bed.Resting more than 1 or 2 days can delay your recovery.  Do not avoid exercise or work.Your body is made to move.It is not dangerous to be active, even though your back may hurt.Your back will likely heal faster if you return to being active before your pain is gone.  Pay attention to your body when you bend and lift. Many people have less discomfortwhen lifting if they bend their knees, keep the load close to their bodies,and  avoid twisting. Often, the most comfortable positions are those that put less stress on your recovering back.  Find a comfortable position to sleep. Use a firm mattress and lie on your side with your knees slightly bent. If you lie on your back, put a pillow under your knees.  Only take over-the-counter or prescription medicines as directed by your caregiver. Over-the-counter medicines to reduce pain and inflammation are often the most helpful.Your caregiver may prescribe muscle relaxant drugs.These medicines help dull your pain so you can more quickly return to your normal activities and healthy exercise.  Put ice on the injured area.  Put ice in a plastic bag.  Place a towel between your skin and the bag.  Leave the ice on for 15-20 minutes, 03-04 times a day for the first 2 to 3 days. After that, ice and heat may be alternated to reduce pain and spasms.  Ask your caregiver about trying back exercises and gentle massage. This may be of some benefit.  Avoid feeling anxious or stressed.Stress increases muscle tension and can worsen back pain.It is important to recognize when you are anxious or stressed and learn ways to manage it.Exercise is a great option. SEEK MEDICAL CARE IF:  You have pain that is not relieved with rest or medicine.  You have pain that does not improve in 1 week.  You have new symptoms.  You are generally not feeling well. SEEK  IMMEDIATE MEDICAL CARE IF:   You have pain that radiates from your back into your legs.  You develop new bowel or bladder control problems.  You have unusual weakness or numbness in your arms or legs.  You develop nausea or vomiting.  You develop abdominal pain.  You feel faint. Document Released: 08/22/2005 Document Revised: 02/21/2012 Document Reviewed: 12/24/2013 [facility] Patient Information 2015 [place], [place]. This information is not intended to replace advice given to you by your health care provider. Make sure you  discuss any questions you have with your health care provider.  Head Injury You have received a head injury. It does not appear serious at this time. Headaches and vomiting are common following head injury. It should be easy to awaken from sleeping. Sometimes it is necessary for you to stay in the emergency department for a while for observation. Sometimes admission to the hospital may be needed. After injuries such as yours, most problems occur within the first 24 hours, but side effects may occur up to 7-10 days after the injury. It is important for you to carefully monitor your condition and contact your health care provider or seek immediate medical care if there is a change in your condition. WHAT ARE THE TYPES OF HEAD INJURIES? Head injuries can be as minor as a bump. Some head injuries can be more severe. More severe head injuries include:  A jarring injury to the brain (concussion).  A bruise of the brain (contusion). This mean there is bleeding in the brain that can cause swelling.  A cracked skull (skull fracture).  Bleeding in the brain that collects, clots, and forms a bump (hematoma). WHAT CAUSES A HEAD INJURY? A serious head injury is most likely to happen to someone who is in a car wreck and is not wearing a seat belt. Other causes of major head injuries include bicycle or motorcycle accidents, sports injuries, and falls. HOW ARE HEAD INJURIES DIAGNOSED? A complete history of the event leading to the injury and your current symptoms will be helpful in diagnosing head injuries. Many times, pictures of the brain, such as CT or MRI are needed to see the extent of the injury. Often, an overnight hospital stay is necessary for observation.  WHEN SHOULD I SEEK IMMEDIATE MEDICAL CARE?  You should get help right away if:  You have confusion or drowsiness.  You feel sick to your stomach (nauseous) or have continued, forceful vomiting.  You have dizziness or unsteadiness that is getting  worse.  You have severe, continued headaches not relieved by medicine. Only take over-the-counter or prescription medicines for pain, fever, or discomfort as directed by your health care provider.  You do not have normal function of the arms or legs or are unable to walk.  You notice changes in the black spots in the center of the colored part of your eye (pupil).  You have a clear or bloody fluid coming from your nose or ears.  You have a loss of vision. During the next 24 hours after the injury, you must stay with someone who can watch you for the warning signs. This person should contact local emergency services (911 in the U.S.) if you have seizures, you become unconscious, or you are unable to wake up. HOW CAN I PREVENT A HEAD INJURY IN THE FUTURE? The most important factor for preventing major head injuries is avoiding motor vehicle accidents. To minimize the potential for damage to your head, it is crucial to wear seat belts  while riding in motor vehicles. Wearing helmets while bike riding and playing collision sports (like football) is also helpful. Also, avoiding dangerous activities around the house will further help reduce your risk of head injury.  WHEN CAN I RETURN TO NORMAL ACTIVITIES AND ATHLETICS? You should be reevaluated by your health care provider before returning to these activities. If you have any of the following symptoms, you should not return to activities or contact sports until 1 week after the symptoms have stopped:  Persistent headache.  Dizziness or vertigo.  Poor attention and concentration.  Confusion.  Memory problems.  Nausea or vomiting.  Fatigue or tire easily.  Irritability.  Intolerant of bright lights or loud noises.  Anxiety or depression.  Disturbed sleep. MAKE SURE YOU:   Understand these instructions.  Will watch your condition.  Will get help right away if you are not doing well or get worse. Document Released: 08/22/2005  Document Revised: 08/27/2013 Document Reviewed: 04/29/2013 [facility] Patient Information 2015 [place]. This information is not intended to replace advice given to you by your health care provider. Make sure you discuss any questions you have with your health care provider.   Emergency Department Resource Guide 1) Find a Doctor and Pay Out of Pocket Although you won't have to find out who is covered by your insurance plan, it is a good idea to ask around and get recommendations. You will then need to call the office and see if the doctor you have chosen will accept you as a new patient and what types of options they offer for patients who are self-pay. Some doctors offer discounts or will set up payment plans for their patients who do not have insurance, but you will need to ask so you aren't surprised when you get to your appointment.  2) Contact Your Local Health Department Not all health departments have doctors that can see patients for sick visits, but many do, so it is worth a call to see if yours does. If you don't know where your local health department is, you can check in your phone book. The CDC also has a tool to help you locate your state's health department, and many state websites also have listings of all of their local health departments.  3) Find a Walk-in Clinic If your illness is not likely to be very severe or complicated, you may want to try a walk in clinic. These are popping up all over the country in pharmacies, drugstores, and shopping centers. They're usually staffed by nurse practitioners or physician assistants that have been trained to treat common illnesses and complaints. They're usually fairly quick and inexpensive. However, if you have serious medical issues or chronic medical problems, these are probably not your best option.  No Primary Care Doctor: - Call Health Connect at  [phone] - they can help you locate a primary care doctor that  accepts your  insurance, provides certain services, etc. - Physician Referral Service- [phone]  Chronic Pain Problems: Organization         Address  Phone   Notes  [facility]  [phone] Patients need to be referred by their primary care doctor.   Medication Assistance: Organization         Address  Phone   Notes  [facility] [address] [place], [place] [phone] --Must be a resident of [facility] -- Must have NO insurance coverage whatsoever (no Medicaid/ Medicare,  etc.) -- The pt. MUST have a primary care doctor that directs their care regularly and follows them in the community   [facility]  [phone]   [employer]  [phone]    Agencies that provide inexpensive medical care: Organization         Address  Phone   Notes  [facility]  [phone]   [facility]    [phone]   [facility] [address] [place], [place] [address] [phone]   [facility] [address] [place] [place] [phone]   Planned Parenthood    [phone]   [facility]    [phone]   Community [facility] and [facility]  [address], [place] Phone:  [phone], Fax:  [phone] Hours of Operation:  9 am - 6 pm, M-F.  Also accepts Medicaid/Medicare and self-pay.  [facility]  [address], [place] Phone: [phone], Fax: [phone]. Hours of Operation:  8:30 am - 5:30 pm, M-F.  Also accepts Medicaid and self-pay.  [facility] [place] [address] [place] Phone: [phone]   [facility] [address] Natasha Bence [place], [place] [phone], Ext. [phone] Mondays & Thursdays: 7-9 AM.  First 15 patients are seen on a first come, first serve basis.    Medicaid-accepting [employer] Providers:  Organization          Address  Phone   Notes  [facility] [address], [place] [phone] Also accepts self-pay patients.  [facility] [address] Laurell Josephs [place], [place]  [phone]   [facility] [address], [place] [phone]   [facility] [address] [place] [phone]   Renaye Rakers [address], [place]   [phone] Only accepts [place] patients after they have their name applied to their card.   Self-Pay (no insurance) in [facility]:  Organization         Address  Phone   Notes  Sickle Cell Patients, [facility] Internal Medicine [address] [place], [place] [phone]   [facility] [address] [place], [place] [phone]   [facility]  [address], [place] [phone]   [facility]/Dr. Osei-Bonsu  [address], [place] or [address], [place] [phone] Phone number for both [place] and [place] locations is the same.  [facility]gent Medical and [facility] [address] [place] [phone]   [facility] [address] [place] [address] [place] or [address] [phone]   [facility] [address], [place] [phone], phone; [phone], fax Sees patients 1st and 3rd Saturday of every month.  Must not qualify for public or private insurance (i.e. Medicaid, Medicare, [place] Health Choice, Veterans' Benefits)  Household income should be no more than 200% of the poverty level The clinic cannot treat you if you are pregnant or think you are pregnant  Sexually transmitted diseases are not treated at the clinic.    Dental Care: Organization         Address  Phone  Notes  [facility] of Public [facility]  [address] [place],  [place] [phone] Accepts children up to age 59 who are enrolled in [place] or [place] Health Choice; pregnant women with a Medicaid card; and children who have applied for Medicaid or [place] Health Choice, but were declined, whose parents can pay a reduced fee at time of service.   [facility] of [facility]  [address], [place] [phone] Accepts children up to age 21 who are enrolled in [place] or [place] Health Choice; pregnant women with a Medicaid card; and children who have applied for Medicaid or  Health Choice, but were declined, whose parents can pay a reduced fee at time of service.  [facility]  [address] [place], [place] [phone] Patients are seen by appointment only. Walk-ins are not accepted. [facility] will see patients 38 years of age and older. Monday - Tuesday (8am-5pm) Most Wednesdays (8:30-5pm) $30 per visit, cash only  [facility] Adult Dental Access PRO[facility]  [address], [facility] [phone] Patients are seen by appointment only. Walk-ins are not accepted. [facility] will see patients 60 years of age and older. One Wednesday Evening (Monthly: Volunteer Based).  $30 per visit, cash only  [employer]  [phone] for adults; Children under age 70, call Graduate Pediatric Dentistry at [phone]. Children aged 77-14, please call [phone] to request a pediatric application.  Dental services are provided in all areas of dental care including fillings, crowns and bridges, complete and partial dentures, implants, gum treatment, root canals, and extractions. Preventive care is also provided. Treatment is provided to both adults and children. Patients are selected via a lottery and there is often a waiting list.   [facility] [address], [place]  [phone] [email or web address]   [facility] [address] [place], [place]  [phone], Ext. [phone] Second and Fourth Thursday of each month, opens at 6:30 AM; Clinic ends at 9 AM.  Patients are seen on a first-come first-served basis, and a limited number are seen during each clinic.   [facility]  [address] Ether Griffins [place], [place] [phone]   Eligibility Requirements You must have lived in [place], [place], or [place] counties for at least the last three months.   You cannot be eligible for state or federal sponsored [place], including [employer], [place], or [employer].   You generally cannot be eligible for healthcare insurance through your employer.    How to apply: Eligibility screenings are held every Tuesday and Wednesday afternoon from 1:00 pm until 4:00 pm. You do not need an appointment for the interview!  [facility] [address], [place], [place] [phone]   [facility] Health [facility]  [phone]   [facility] Health Department  [phone]   [facility] Health Department  [phone]    Behavioral Health Resources in the Community: Intensive Outpatient Programs Organization         Address  Phone  Notes  [facility] [address], [place], [place] [phone]   [facility] [address], [place], [place] [phone]   [facility] [address], [place], [place]  [phone]   [facility] [address],  [place], [place] [phone] or [phone]   Substance Abuse Resources Organization         Address  Phone  Notes  [facility] Drug [facility]  [phone]   [facility]  [phone]   [facility] [place]  [phone]   Floydene Flock  [phone]   Residential & Outpatient Substance Abuse Program  [phone]   Psychological Services Organization         Address  Phone  Notes  [facility]  [phone]   [facility]  [phone]    [facility] [address] [place] [phone] or [phone]    Mobile Crisis Teams Organization         Address  Phone  Notes  [facility], Mobile Crisis Care Unit  [phone]   [facility]  [address] [place], [place] [phone]   Doristine Locks [address] [place] [phone]    Self-Help/Support Groups Organization         Address  Phone             Notes  [facility] [place] - variety of support groups  [phone]- I7437963 Call for more information  Narcotics Anonymous (NA), [facility] [address], [employer] [place]  2 meetings at this location   Statistician         Address  Phone  Notes  [facility] [address],    [place]  [phone]   [facility]  [address], [place] [address], [place], [place] [phone]   [facility] [address] [place], [place] [phone] Admissions: 8am-3pm M-F  [facility] [address],    [place], [place] [phone]   [facility] [address] [place], [place], [place] [phone]   [facility] [address],  [place], [place] [phone]   [facility] - Intensive Outpatient [address] Laurell Josephs [address], [place], [place] [phone]   [facility] ([facility] [address] [place]  [place], [place] [phone] or [phone]   [facility] ([facility]) [address], [place], [place] [phone] Accepts Medicaid  [facility] [address]  [place] [phone] Substance Abuse/Addiction Treatment   [facility] Organization         Address  Phone  Notes  [facility]  [phone]   Angie Fava, PhD [address] Ervin Knack [place], [place]   [phone] or [phone]   [facility]   [address] [place], [place] [phone]   [facility] [address], [place], [place] [phone] Insurance/Medicaid/sponsorship through [facility] and Families [address]                                    [place], [place] [phone] Therapy/tele-psych/case  [facility] [address], [place] [phone]    Dr. Lolly Mustache  [phone]   [facility] [facility]  [employer] [facility] Dept. 1) [address] [place] 2) [address], [place] 3)  [address], [place] [phone]  [phone]   [facility] Child Abuse Hotline [phone]) [phone] or [phone] (After Hours)        Your xrays are negative for acute injury today.  Apply ice to your back and neck as much as is comfortable for the remainder of the day.  You may add a heating pad for 20 minutes 2-3 times daily starting tomorrow as needed for additional pain relief.
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
## 2016-12-03 ENCOUNTER — Emergency Department (HOSPITAL_COMMUNITY)
Admission: EM | Admit: 2016-12-03 | Discharge: 2016-12-04 | Disposition: A | Discharge status: Other Acute Inpatient Hospital | Payer: 59 | Attending: Emergency Medicine | Admitting: Emergency Medicine

## 2016-12-03 ENCOUNTER — Encounter (HOSPITAL_COMMUNITY): Payer: Self-pay | Admitting: Emergency Medicine

## 2016-12-03 DIAGNOSIS — T39314A Poisoning by propionic acid derivatives, undetermined, initial encounter: Secondary | ICD-10-CM | POA: Insufficient documentation

## 2016-12-03 DIAGNOSIS — Z5181 Encounter for therapeutic drug level monitoring: Secondary | ICD-10-CM | POA: Insufficient documentation

## 2016-12-03 DIAGNOSIS — T50904A Poisoning by unspecified drugs, medicaments and biological substances, undetermined, initial encounter: Secondary | ICD-10-CM

## 2016-12-03 DIAGNOSIS — F17210 Nicotine dependence, cigarettes, uncomplicated: Secondary | ICD-10-CM | POA: Insufficient documentation

## 2016-12-03 LAB — ACETAMINOPHEN LEVEL: Acetaminophen (Tylenol), Serum: <10 ug/mL — ABNORMAL LOW (ref 10–30)

## 2016-12-03 LAB — COMPREHENSIVE METABOLIC PANEL
ALK PHOS: 51 U/L (ref 38–126)
ALT: 28 U/L (ref 14–54)
ALT: 29 U/L (ref 14–54)
ANION GAP: 12 (ref 5–15)
AST: 17 U/L (ref 15–41)
AST: 20 U/L (ref 15–41)
Albumin: 4.5 g/dL (ref 3.5–5.0)
Albumin: 4.7 g/dL (ref 3.5–5.0)
Alkaline Phosphatase: 43 U/L (ref 38–126)
Anion gap: 12 (ref 5–15)
BILIRUBIN TOTAL: 0.3 mg/dL (ref 0.3–1.2)
BUN: 7 mg/dL (ref 6–20)
BUN: 6 mg/dL (ref 6–20)
CALCIUM: 9.8 mg/dL (ref 8.9–10.3)
CALCIUM: 10.2 mg/dL (ref 8.9–10.3)
CO2: 23 mmol/L (ref 22–32)
CO2: 23 mmol/L (ref 22–32)
Chloride: 103 mmol/L (ref 101–111)
Chloride: 108 mmol/L (ref 101–111)
Creatinine, Ser: 0.63 mg/dL (ref 0.44–1.00)
Creatinine, Ser: 0.63 mg/dL (ref 0.44–1.00)
GFR calc Af Amer: >60 mL/min (ref >60)
GFR calc Af Amer: >60 mL/min (ref >60)
GFR calc non Af Amer: >60 mL/min (ref >60)
GFR calc non Af Amer: >60 mL/min (ref >60)
GLUCOSE: 93 mg/dL (ref 65–99)
Glucose, Bld: 65 mg/dL (ref 65–99)
POTASSIUM: 3.2 mmol/L — AB (ref 3.5–5.1)
POTASSIUM: 4 mmol/L (ref 3.5–5.1)
Sodium: 138 mmol/L (ref 135–145)
Sodium: 143 mmol/L (ref 135–145)
TOTAL PROTEIN: 8.1 g/dL (ref 6.5–8.1)
TOTAL PROTEIN: 8 g/dL (ref 6.5–8.1)
Total Bilirubin: 0.4 mg/dL (ref 0.3–1.2)

## 2016-12-03 LAB — SALICYLATE LEVEL
Salicylate Lvl: <7 mg/dL (ref 2.8–30.0)
Salicylate Lvl: <7 mg/dL (ref 2.8–30.0)

## 2016-12-03 LAB — CBC
HEMATOCRIT: 37.2 % (ref 36.0–46.0)
Hemoglobin: 13.1 g/dL (ref 12.0–15.0)
MCH: 30.9 pg (ref 26.0–34.0)
MCHC: 35.2 g/dL (ref 30.0–36.0)
MCV: 87.7 fL (ref 78.0–100.0)
PLATELETS: 348 10*3/uL (ref 150–400)
RBC: 4.24 MIL/uL (ref 3.87–5.11)
RDW: 12.4 % (ref 11.5–15.5)
WBC: 6 10*3/uL (ref 4.0–10.5)

## 2016-12-03 LAB — RAPID URINE DRUG SCREEN, HOSP PERFORMED
AMPHETAMINES: NOT DETECTED
BARBITURATES: NOT DETECTED
BENZODIAZEPINES: NOT DETECTED
Cocaine: NOT DETECTED
Opiates: NOT DETECTED
TETRAHYDROCANNABINOL: POSITIVE — AB

## 2016-12-03 LAB — PROTIME-INR
INR: 1.02
Prothrombin Time: 13.4 seconds (ref 11.4–15.2)

## 2016-12-03 LAB — ETHANOL

## 2016-12-03 LAB — PREGNANCY, URINE: Preg Test, Ur: NEGATIVE

## 2016-12-03 MED ORDER — ONDANSETRON HCL 4 MG/2ML IJ SOLN
4.0000 mg | INTRAMUSCULAR | Status: DC | PRN
  Filled 2016-12-03: qty 2

## 2016-12-03 MED ORDER — ACETAMINOPHEN 325 MG PO TABS: 650.0000 mg | ORAL_TABLET | ORAL | Status: DC | PRN

## 2016-12-03 MED ORDER — ONDANSETRON HCL 4 MG PO TABS
4.0000 mg | ORAL_TABLET | Freq: Three times a day (TID) | ORAL | Status: DC | PRN
  Administered 2016-12-04 (×2): 4 mg via ORAL
  Filled 2016-12-03 (×2): qty 1

## 2016-12-03 MED ORDER — POTASSIUM CHLORIDE CRYS ER 20 MEQ PO TBCR
40.0000 meq | EXTENDED_RELEASE_TABLET | Freq: Once | ORAL | Status: DC
  Filled 2016-12-03: qty 2

## 2016-12-03 MED ORDER — ONDANSETRON 4 MG PO TBDP
4.0000 mg | ORAL_TABLET | Freq: Once | ORAL | Status: AC
  Administered 2016-12-03: 4 mg via ORAL
  Filled 2016-12-03: qty 1

## 2016-12-03 NOTE — ED Notes (Addendum)
Spoke with poison control.  Their recommendations:  Get ekg, check cmp, asa and tylenol level x 4 hours when pt took ibuprofen (watching for metabolic acidosis) ,  Also supportive care for GI s/s, may give antiemetics and fluids.  If pt is ok after 6 hours pt can be discharged.
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
## 2016-12-03 NOTE — BH Assessment (Addendum)
Tele Assessment Note   Audrey Reid is an 26 y.o. female who presents to the ED voluntarily after intentionally ingesting more than 100 ibuprofen tablets due to "breaking up with her girlfriend." Pt minimizing during the assessment and stated "I wasn't trying to kill myself. I don't know what I was trying to do. We broke up 2 weeks ago but it all just hit me yesterday and I realized it was really over so I just took the pills." Pt denies any prior suicide attempts and denies HI. Pt reports she has been experiencing crying spells for the past 2 weeks and she stated "I just realized it is nothing I can do." Pt stated "I just took one too many ibuprofen's." Pt stated "to be honest I was just feeling broken hearted at the time." Per chart, pt reported she acted impulsively and "just did this in the heat of the moment." Per EMS report, approximately 124 ibuprofen were missing from the bottle at the scene.  Pt labs show positive for marijuana on arrival to ED however when pt was asked about drug or alcohol use, she denied. Pt stated "I don't know why it's positive for marijuana." Pt denies prior psych history and denies AVH. Reports this is the first time she has attempted anything like this.  Per Nira Conn, NP pt meets criteria for inpt treatment. Report given to [place] at [phone].    Diagnosis: Unspecified Depressive D/O   Past Medical History:  Past Medical History:  Diagnosis Date   Chronic back pain    Chronic shoulder pain     Past Surgical History:  Procedure Laterality Date   CYST EXCISION      Family History: No family history on file.  Social History:  reports that she has been smoking Cigarettes.  She has a 1.00 pack-year smoking history. She has never used smokeless tobacco. She reports that she drinks alcohol. She reports that she does not use drugs.  Additional Social History:  Alcohol / Drug Use Pain Medications: See PTA meds  Prescriptions: See PTA meds  Over  the Counter: See PTA meds  History of alcohol / drug use?: Yes Longest period of sobriety (when/how long): unknown Substance #1 Name of Substance 1: Cannabis 1 - Age of First Use: unknown, pt denies use although labs show positive on arrival to ED 1 - Amount (size/oz): unknown 1 - Frequency: unknown 1 - Duration: unknown 1 - Last Use / Amount: unknown  CIWA: CIWA-Ar BP: (!) 135/91 Pulse Rate: 100 COWS:    PATIENT STRENGTHS: (choose at least two) Average or above average intelligence Capable of independent living Communication skills Financial means  Allergies: No Known Allergies  Home Medications:  (Not in a hospital admission)  OB/GYN Status:  Patient's last menstrual period was 11/23/2016.  General Assessment Data Location of Assessment: [facility] TTS Assessment: In system Is this a Tele or Face-to-Face Assessment?: Tele Assessment Is this an Initial Assessment or a Re-assessment for this encounter?: Initial Assessment Marital status: Single Is patient pregnant?: No Pregnancy Status: No Living Arrangements: Alone Can pt return to current living arrangement?: Yes Admission Status: Voluntary Is patient capable of signing voluntary admission?: Yes Referral Source: Self/Family/Friend Insurance type: [facility]     Crisis Care Plan Living Arrangements: Alone Name of Psychiatrist: none Name of Therapist: none  Education Status Is patient currently in school?: No Highest grade of school patient has completed: 12th  Risk to self with the past 6 months Suicidal Ideation: Yes-Currently Present  Has patient been a risk to self within the past 6 months prior to admission? : Yes Suicidal Intent: No Has patient had any suicidal intent within the past 6 months prior to admission? : No Is patient at risk for suicide?: Yes Suicidal Plan?: Yes-Currently Present (pt denies this was a suicide attempt ) Has patient had any suicidal plan within the past 6 months prior to admission? :  Yes Specify Current Suicidal Plan: pt admitted to taking more than 100 tablets of ibuprofen but states she was not trying to kill herself Access to Means: Yes Specify Access to Suicidal Means: pt has access to medication  What has been your use of drugs/alcohol within the last 12 months?: pt denies use however labs show positive for cannabis on arrival to ED  Previous Attempts/Gestures: No Triggers for Past Attempts: None known Intentional Self Injurious Behavior: None Family Suicide History: No Recent stressful life event(s): Loss (Comment) (relationship ending ) Persecutory voices/beliefs?: No Depression: Yes Depression Symptoms: Despondent, Feeling worthless/self pity Substance abuse history and/or treatment for substance abuse?: No Suicide prevention information given to non-admitted patients: Not applicable  Risk to Others within the past 6 months Homicidal Ideation: No Does patient have any lifetime risk of violence toward others beyond the six months prior to admission? : No Thoughts of Harm to Others: No Current Homicidal Intent: No Current Homicidal Plan: No Access to Homicidal Means: No History of harm to others?: No Assessment of Violence: None Noted Does patient have access to weapons?: No Criminal Charges Pending?: No Does patient have a court date: No Is patient on probation?: No  Psychosis Hallucinations: None noted Delusions: None noted  Mental Status Report Appearance/Hygiene: Unremarkable Eye Contact: Good Motor Activity: Freedom of movement Speech: Logical/coherent Level of Consciousness: Alert Mood: Depressed, Guilty Affect: Depressed Anxiety Level: None Thought Processes: Coherent, Relevant Judgement: Impaired Orientation: Person, Place, Time, Appropriate for developmental age Obsessive Compulsive Thoughts/Behaviors: None  Cognitive Functioning Concentration: Normal Memory: Recent Intact, Remote Intact IQ: Average Insight: Poor Impulse  Control: Poor Appetite: Good Sleep: No Change Total Hours of Sleep: 8 Vegetative Symptoms: None  ADLScreening [facility] Assessment Services) Patient's cognitive ability adequate to safely complete daily activities?: Yes Patient able to express need for assistance with ADLs?: Yes Independently performs ADLs?: Yes (appropriate for developmental age)  Prior Inpatient Therapy Prior Inpatient Therapy: No  Prior Outpatient Therapy Prior Outpatient Therapy: No Does patient have an ACCT team?: No Does patient have Intensive In-House Services?  : No Does patient have [facility] services? : No Does patient have P4CC services?: No  ADL Screening (condition at time of admission) Patient's cognitive ability adequate to safely complete daily activities?: Yes Is the patient deaf or have difficulty hearing?: No Does the patient have difficulty seeing, even when wearing glasses/contacts?: No Does the patient have difficulty concentrating, remembering, or making decisions?: No Patient able to express need for assistance with ADLs?: Yes Does the patient have difficulty dressing or bathing?: No Independently performs ADLs?: Yes (appropriate for developmental age) Does the patient have difficulty walking or climbing stairs?: No Weakness of Legs: None Weakness of Arms/Hands: None  Home Assistive Devices/Equipment Home Assistive Devices/Equipment: Eyeglasses    Abuse/Neglect Assessment (Assessment to be complete while patient is alone) Physical Abuse: Denies Verbal Abuse: Denies Sexual Abuse: Denies Exploitation of patient/patient's resources: Denies Self-Neglect: Denies     Merchant navy officer (For Healthcare) Does Patient Have a Medical Advance Directive?: No Would patient like information on creating a medical advance directive?: No - Patient declined  Additional Information 1:1 In Past 12 Months?: No CIRT Risk: No Elopement Risk: No Does patient have medical clearance?: Yes      Disposition:  Disposition Initial Assessment Completed for this Encounter: Yes Disposition of Patient: Inpatient treatment program Type of inpatient treatment program: Adult (per Nira Conn, NP)  Karolee Ohs 12/04/2016 12:06 AM

## 2016-12-03 NOTE — ED Notes (Signed)
Pt ambulating to restroom with sitter.  
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
## 2016-12-04 ENCOUNTER — Encounter (HOSPITAL_COMMUNITY): Payer: Self-pay

## 2016-12-04 ENCOUNTER — Inpatient Hospital Stay (HOSPITAL_COMMUNITY)
Admission: AD | Admit: 2016-12-04 | Discharge: 2016-12-07 | DRG: 885 | Disposition: A | Discharge status: Home / Self Care | Payer: No Typology Code available for payment source | Source: Intra-hospital | Attending: Psychiatry | Admitting: Psychiatry

## 2016-12-04 DIAGNOSIS — F322 Major depressive disorder, single episode, severe without psychotic features: Principal | ICD-10-CM | POA: Diagnosis present

## 2016-12-04 DIAGNOSIS — T50902A Poisoning by unspecified drugs, medicaments and biological substances, intentional self-harm, initial encounter: Secondary | ICD-10-CM | POA: Diagnosis present

## 2016-12-04 DIAGNOSIS — F17210 Nicotine dependence, cigarettes, uncomplicated: Secondary | ICD-10-CM | POA: Diagnosis present

## 2016-12-04 DIAGNOSIS — T39392A Poisoning by other nonsteroidal anti-inflammatory drugs [NSAID], intentional self-harm, initial encounter: Secondary | ICD-10-CM | POA: Diagnosis present

## 2016-12-04 DIAGNOSIS — F4321 Adjustment disorder with depressed mood: Secondary | ICD-10-CM

## 2016-12-04 DIAGNOSIS — F5105 Insomnia due to other mental disorder: Secondary | ICD-10-CM | POA: Diagnosis present

## 2016-12-04 DIAGNOSIS — Z79899 Other long term (current) drug therapy: Secondary | ICD-10-CM | POA: Diagnosis not present

## 2016-12-04 MED ORDER — MAGNESIUM HYDROXIDE 400 MG/5ML PO SUSP: 30.0000 mL | Freq: Every day | ORAL | Status: DC | PRN

## 2016-12-04 MED ORDER — ALUM & MAG HYDROXIDE-SIMETH 200-200-20 MG/5ML PO SUSP: 30.0000 mL | ORAL | Status: DC | PRN

## 2016-12-04 MED ORDER — TRAZODONE HCL 50 MG PO TABS: 50.0000 mg | ORAL_TABLET | Freq: Every evening | ORAL | Status: DC | PRN

## 2016-12-04 MED ORDER — PROMETHAZINE HCL 12.5 MG PO TABS
25.0000 mg | ORAL_TABLET | Freq: Once | ORAL | Status: AC
  Administered 2016-12-04: 25 mg via ORAL
  Filled 2016-12-04: qty 2

## 2016-12-04 MED ORDER — HYDROXYZINE HCL 25 MG PO TABS
25.0000 mg | ORAL_TABLET | Freq: Four times a day (QID) | ORAL | Status: DC | PRN
  Filled 2016-12-04: qty 1

## 2016-12-04 MED ORDER — ACETAMINOPHEN 325 MG PO TABS: 650.0000 mg | ORAL_TABLET | Freq: Four times a day (QID) | ORAL | Status: DC | PRN

## 2016-12-04 MED ORDER — NICOTINE 14 MG/24HR TD PT24
14.0000 mg | MEDICATED_PATCH | Freq: Every day | TRANSDERMAL | Status: DC
  Filled 2016-12-04 (×5): qty 1

## 2016-12-04 NOTE — ED Provider Notes (Signed)
Patient presented for suicide attempt with IBU ingestion. No other ingestants or harm. She has been able to eat both breakfast and lunch and states that she has no abdominal pain.   Results for orders placed or performed during the hospital encounter of 12/03/16  Comprehensive metabolic panel  Result Value Ref Range   Sodium 138 135 - 145 mmol/L   Potassium 3.2 (L) 3.5 - 5.1 mmol/L   Chloride 103 101 - 111 mmol/L   CO2 23 22 - 32 mmol/L   Glucose, Bld 65 65 - 99 mg/dL   BUN 7 6 - 20 mg/dL   Creatinine, Ser 4.54 0.44 - 1.00 mg/dL   Calcium 9.8 8.9 - 09.8 mg/dL   Total Protein 8.1 6.5 - 8.1 g/dL   Albumin 4.5 3.5 - 5.0 g/dL   AST 17 15 - 41 U/L   ALT 28 14 - 54 U/L   Alkaline Phosphatase 43 38 - 126 U/L   Total Bilirubin 0.4 0.3 - 1.2 mg/dL   GFR calc non Af Amer >60 >60 mL/min   GFR calc Af Amer >60 >60 mL/min   Anion gap 12 5 - 15  Ethanol  Result Value Ref Range   Alcohol, Ethyl (B) <5 <5 mg/dL  Salicylate level  Result Value Ref Range   Salicylate Lvl <7.0 2.8 - 30.0 mg/dL  Acetaminophen level  Result Value Ref Range   Acetaminophen (Tylenol), Serum <10 (L) 10 - 30 ug/mL  cbc  Result Value Ref Range   WBC 6.0 4.0 - 10.5 K/uL   RBC 4.24 3.87 - 5.11 MIL/uL   Hemoglobin 13.1 12.0 - 15.0 g/dL   HCT 11.9 14.7 - 82.9 %   MCV 87.7 78.0 - 100.0 fL   MCH 30.9 26.0 - 34.0 pg   MCHC 35.2 30.0 - 36.0 g/dL   RDW 56.2 13.0 - 86.5 %   Platelets 348 150 - 400 K/uL  Pregnancy, urine  Result Value Ref Range   Preg Test, Ur NEGATIVE NEGATIVE  Protime-INR  Result Value Ref Range   Prothrombin Time 13.4 11.4 - 15.2 seconds   INR 1.02   Urine rapid drug screen (hosp performed)  Result Value Ref Range   Opiates NONE DETECTED NONE DETECTED   Cocaine NONE DETECTED NONE DETECTED   Benzodiazepines NONE DETECTED NONE DETECTED   Amphetamines NONE DETECTED NONE DETECTED   Tetrahydrocannabinol POSITIVE (A) NONE DETECTED   Barbiturates NONE DETECTED NONE DETECTED  Acetaminophen level   Result Value Ref Range   Acetaminophen (Tylenol), Serum <10 (L) 10 - 30 ug/mL  Ethanol  Result Value Ref Range   Alcohol, Ethyl (B) <5 <5 mg/dL  Salicylate level  Result Value Ref Range   Salicylate Lvl <7.0 2.8 - 30.0 mg/dL  Comprehensive metabolic panel  Result Value Ref Range   Sodium 143 135 - 145 mmol/L   Potassium 4.0 3.5 - 5.1 mmol/L   Chloride 108 101 - 111 mmol/L   CO2 23 22 - 32 mmol/L   Glucose, Bld 93 65 - 99 mg/dL   BUN 6 6 - 20 mg/dL   Creatinine, Ser 7.84 0.44 - 1.00 mg/dL   Calcium 69.6 8.9 - 29.5 mg/dL   Total Protein 8.0 6.5 - 8.1 g/dL   Albumin 4.7 3.5 - 5.0 g/dL   AST 20 15 - 41 U/L   ALT 29 14 - 54 U/L   Alkaline Phosphatase 51 38 - 126 U/L   Total Bilirubin 0.3 0.3 - 1.2 mg/dL   GFR  calc non Af Amer >60 >60 mL/min   GFR calc Af Amer >60 >60 mL/min   Anion gap 12 5 - 15    Medications  potassium chloride SA (K-DUR,KLOR-CON) CR tablet 40 mEq (40 mEq Oral Not Given 12/03/16 2315)  ondansetron (ZOFRAN) tablet 4 mg (4 mg Oral Given 12/04/16 0030)  acetaminophen (TYLENOL) tablet 650 mg (not administered)  ondansetron (ZOFRAN-ODT) disintegrating tablet 4 mg (4 mg Oral Given 12/03/16 1933)  promethazine (PHENERGAN) tablet 25 mg (25 mg Oral Given 12/04/16 0248)    Patient Vitals for the past 24 hrs:  BP Temp Temp src Pulse Resp SpO2 Height Weight  12/04/16 1419 123/70 98.3 F (36.8 C) Oral 88 18 99 % - -  12/04/16 0654 111/76 97.7 F (36.5 C) Oral 73 15 99 % - -  12/04/16 0209 (!) 131/91 97.7 F (36.5 C) Oral 90 16 100 % - -  12/04/16 0202 - 97.7 F (36.5 C) Oral 90 - - - -  12/03/16 1828 (!) 135/91 99.5 F (37.5 C) Oral 100 20 100 % - -  12/03/16 1811 - - - - - -  (1.702 m) 164 lb (74.4 kg)     The patient is medically cleared for treatment by psychiatry for Suicide attempt with depression and anger outburst.    Mancel Bale, MD 12/04/16 [phone]

## 2016-12-04 NOTE — ED Notes (Signed)
This RN made aware from the pt's sitter that this pt was nauseated before this RN could get her meds, she vomited. Pt will be given zofran for nausea.

## 2016-12-04 NOTE — Progress Notes (Signed)
Pt accepted to [facility] bed 404-1 when [facility] receives documentation that she is medically cleared.  Timmothy Euler. Kaylyn Lim, MSW, LCSWA Clinical Social Work Disposition [phone]

## 2016-12-04 NOTE — Progress Notes (Signed)
Patient is a 26 yr old female voluntarily admitted after ingesting ibuprofen (100 pills) because of a break up with her partner of 2 years. She reports that she has been tearful for the past 2 weeks and that she has been trying to make the relationship work but her partner has been wanting to work it out and then will tell her she just wants to be friends. She denies drug use but her UDS was + for THC. She reports alcohol use at  Maybe 1 drink a moth and smoke 1/2 pack of cigarettes daily. She reports that her mother and siblings are her support system. She has no medical history and this her first admit. She was tearful during the admission with a flat, sad affect. Support and encouragement offered. Meal provided. Patient was oriented to unit and safety maintained on unit with 15 min checks. She currently denies si/hi/a/v hallucinations.

## 2016-12-04 NOTE — ED Notes (Signed)
Belongings given to mother Seeley Southgate.  Audrey Reid is aware and said that it was ok.  Mother wants a call if she is placed or discharged to be able to bring her clothes.  Her mother's phone number is [phone]

## 2016-12-04 NOTE — Tx Team (Signed)
Initial Treatment Plan 12/04/2016 9:44 PM Audrey Reid Linse ZOX:[id number]    PATIENT STRESSORS: Loss of relationship with her partner of 2 years   PATIENT STRENGTHS: Ability for insight Average or above average intelligence Capable of independent living Supportive family/friends   PATIENT IDENTIFIED PROBLEMS: SI  Depression      " I want to be stronger than what I am now."               DISCHARGE CRITERIA:  Improved stabilization in mood, thinking, and/or behavior  PRELIMINARY DISCHARGE PLAN: Return to previous living arrangement  PATIENT/FAMILY INVOLVEMENT: This treatment plan has been presented to and reviewed with the patient, [name], and/or family member.  The patient and family have been given the opportunity to ask questions and make suggestions.  Floyce Stakes, RN 12/04/2016, 9:44 PM

## 2016-12-04 NOTE — ED Notes (Signed)
Pt woke up from her sleep feeling nauseated. EDP aware and orders received for nausea meds.

## 2016-12-04 NOTE — ED Notes (Addendum)
Mother's phone number and nameZAMYAH WIESMAN [phone]

## 2016-12-05 DIAGNOSIS — Z79899 Other long term (current) drug therapy: Secondary | ICD-10-CM

## 2016-12-05 DIAGNOSIS — T50902A Poisoning by unspecified drugs, medicaments and biological substances, intentional self-harm, initial encounter: Secondary | ICD-10-CM | POA: Diagnosis present

## 2016-12-05 DIAGNOSIS — F4321 Adjustment disorder with depressed mood: Secondary | ICD-10-CM

## 2016-12-05 MED ORDER — MIRTAZAPINE 7.5 MG PO TABS
7.5000 mg | ORAL_TABLET | Freq: Every day | ORAL | Status: DC
  Administered 2016-12-05 – 2016-12-06 (×2): 7.5 mg via ORAL
  Filled 2016-12-05 (×4): qty 1
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
## 2016-12-05 NOTE — H&P (Signed)
Psychiatric Admission Assessment Adult  Patient Identification: Audrey Reid MRN:  [id number] Date of Evaluation:  12/05/2016 Chief Complaint:  Overdose  Principal Diagnosis: Suicidal Attempt by Overdose  Diagnosis:   Patient Active Problem List   Diagnosis Date Noted  . MDD (major depressive disorder), single episode, severe (Green) [F32.2] 12/04/2016   History of Present Illness: 26 year old female, who impulsively overdosed on NSAID             ( Ibuprofen) . States " I did take a lot of them", and chart notes indicate there were 124 tablets missing . This occurred on 3/31. Patient states overdose was impulsive, and related to breaking up with her SO a few days earlier, and attempting unsuccessfully to repair the relationship. States " as soon as I took them I regretted doing it and I called the ambulance like 5 minutes later ". States that before break up she was not feeling depressed , and had no prior suicide ideations Reports some neuro vegetative symptoms as below on days leading up to overdose, but denies any at this time.  Associated Signs/Symptoms: Depression Symptoms:  depressed mood, insomnia, suicidal attempt, decreased appetite, (Hypo) Manic Symptoms: Denies  Anxiety Symptoms:  Denies  Psychotic Symptoms:  Denies  PTSD Symptoms: Denies  Total Time spent with patient: 45 minutes  Past Psychiatric History: no prior psychiatric admissions, no prior suicide attempts, no history of self cutting, denies history of violence, denies history of psychosis, denies history of mania or hypomania, denies any panic or agoraphobia, denies history of excessive anxiety, denies history of PTSD. She denies prior history of severe or significant depressive episodes .Has never been on any psychiatric medications in the past .  Is the patient at risk to self? No.  Has the patient been a risk to self in the past 6 months? No.  Has the patient been a risk to self within the distant past? No.   Is the patient a risk to others? No.  Has the patient been a risk to others in the past 6 months? No.  Has the patient been a risk to others within the distant past? No.   Prior Inpatient Therapy:  denies  Prior Outpatient Therapy:  denies   Alcohol Screening: 1. How often do you have a drink containing alcohol?: Monthly or less 2. How many drinks containing alcohol do you have on a typical day when you are drinking?: 1 or 2 3. How often do you have six or more drinks on one occasion?: Never Preliminary Score: 0 9. Have you or someone else been injured as a result of your drinking?: No 10. Has a relative or friend or a doctor or another health worker been concerned about your drinking or suggested you cut down?: No Alcohol Use Disorder Identification Test Final Score (AUDIT): 1 Brief Intervention: AUDIT score less than 7 or less-screening does not suggest unhealthy drinking-brief intervention not indicated Substance Abuse History in the last 12 months:  Denies drug or alcohol abuse  Consequences of Substance Abuse: Denies  Previous Psychotropic Medications:  No  Psychological Evaluations: denies  Past Medical History: denies medical illnesses , NKDA, smokes 1/2 PPD  Past Medical History:  Diagnosis Date  . Chronic back pain   . Chronic shoulder pain     Past Surgical History:  Procedure Laterality Date  . CYST EXCISION     Family History: Parents alive , separated, she is close to mother but distant from father, has 2 brothers  and one sister  Family Psychiatric  History: denies any history of mental illness , no history of suicides in family Tobacco Screening: Have you used any form of tobacco in the last 30 days? (Cigarettes, Smokeless Tobacco, Cigars, and/or Pipes): Yes Tobacco use, Select all that apply: 5 or more cigarettes per day Are you interested in Tobacco Cessation Medications?: Yes, will notify MD for an order Counseled patient on smoking cessation including  recognizing danger situations, developing coping skills and basic information about quitting provided: Refused/Declined practical counseling Social History: single, no children, lives alone, but is planning to move in with her mother after discharge, employed at a [employer], recent break up with SO is described as major stressor, no legal issues  History  Alcohol Use  . Yes    Comment: sometimes      History  Drug Use No    Additional Social History: Marital status: Single (Pt recently got out of a 2 year relationship and is very upset about it ) Are you sexually active?: Yes What is your sexual orientation?: Lesbian  Has your sexual activity been affected by drugs, alcohol, medication, or emotional stress?: No Does patient have children?: No  Allergies:  No Known Allergies Lab Results:  Results for orders placed or performed during the hospital encounter of 12/03/16 (from the past 48 hour(s))  Pregnancy, urine     Status: None   Collection Time: 12/03/16  7:05 PM  Result Value Ref Range   Preg Test, Ur NEGATIVE NEGATIVE  Urine rapid drug screen (hosp performed)     Status: Abnormal   Collection Time: 12/03/16  7:05 PM  Result Value Ref Range   Opiates NONE DETECTED NONE DETECTED   Cocaine NONE DETECTED NONE DETECTED   Benzodiazepines NONE DETECTED NONE DETECTED   Amphetamines NONE DETECTED NONE DETECTED   Tetrahydrocannabinol POSITIVE (A) NONE DETECTED   Barbiturates NONE DETECTED NONE DETECTED    Comment:        DRUG SCREEN FOR MEDICAL PURPOSES ONLY.  IF CONFIRMATION IS NEEDED FOR ANY PURPOSE, NOTIFY LAB WITHIN 5 DAYS.        LOWEST DETECTABLE LIMITS FOR URINE DRUG SCREEN Drug Class       Cutoff (ng/mL) Amphetamine      1000 Barbiturate      200 Benzodiazepine   268 Tricyclics       341 Opiates          300 Cocaine          300 THC              50   Comprehensive metabolic panel     Status: Abnormal   Collection Time: 12/03/16  7:09 PM  Result Value Ref Range    Sodium 138 135 - 145 mmol/L   Potassium 3.2 (L) 3.5 - 5.1 mmol/L   Chloride 103 101 - 111 mmol/L   CO2 23 22 - 32 mmol/L   Glucose, Bld 65 65 - 99 mg/dL   BUN 7 6 - 20 mg/dL   Creatinine, Ser 0.63 0.44 - 1.00 mg/dL   Calcium 9.8 8.9 - 10.3 mg/dL   Total Protein 8.1 6.5 - 8.1 g/dL   Albumin 4.5 3.5 - 5.0 g/dL   AST 17 15 - 41 U/L   ALT 28 14 - 54 U/L   Alkaline Phosphatase 43 38 - 126 U/L   Total Bilirubin 0.4 0.3 - 1.2 mg/dL   GFR calc non Af Amer >60 >60 mL/min   GFR  calc Af Amer >60 >60 mL/min    Comment: (NOTE) The eGFR has been calculated using the CKD EPI equation. This calculation has not been validated in all clinical situations. eGFR's persistently <60 mL/min signify possible Chronic Kidney Disease.    Anion gap 12 5 - 15  Ethanol     Status: None   Collection Time: 12/03/16  7:09 PM  Result Value Ref Range   Alcohol, Ethyl (B) <5 <5 mg/dL    Comment:        LOWEST DETECTABLE LIMIT FOR SERUM ALCOHOL IS 5 mg/dL FOR MEDICAL PURPOSES ONLY   Salicylate level     Status: None   Collection Time: 12/03/16  7:09 PM  Result Value Ref Range   Salicylate Lvl <4.0 2.8 - 30.0 mg/dL  Acetaminophen level     Status: Abnormal   Collection Time: 12/03/16  7:09 PM  Result Value Ref Range   Acetaminophen (Tylenol), Serum <10 (L) 10 - 30 ug/mL    Comment:        THERAPEUTIC CONCENTRATIONS VARY SIGNIFICANTLY. A RANGE OF 10-30 ug/mL MAY BE AN EFFECTIVE CONCENTRATION FOR MANY PATIENTS. HOWEVER, SOME ARE BEST TREATED AT CONCENTRATIONS OUTSIDE THIS RANGE. ACETAMINOPHEN CONCENTRATIONS >150 ug/mL AT 4 HOURS AFTER INGESTION AND >50 ug/mL AT 12 HOURS AFTER INGESTION ARE OFTEN ASSOCIATED WITH TOXIC REACTIONS.   cbc     Status: None   Collection Time: 12/03/16  7:09 PM  Result Value Ref Range   WBC 6.0 4.0 - 10.5 K/uL   RBC 4.24 3.87 - 5.11 MIL/uL   Hemoglobin 13.1 12.0 - 15.0 g/dL   HCT 37.2 36.0 - 46.0 %   MCV 87.7 78.0 - 100.0 fL   MCH 30.9 26.0 - 34.0 pg   MCHC 35.2 30.0  - 36.0 g/dL   RDW 12.4 11.5 - 15.5 %   Platelets 348 150 - 400 K/uL  Protime-INR     Status: None   Collection Time: 12/03/16  7:09 PM  Result Value Ref Range   Prothrombin Time 13.4 11.4 - 15.2 seconds   INR 1.02   Acetaminophen level     Status: Abnormal   Collection Time: 12/03/16  9:45 PM  Result Value Ref Range   Acetaminophen (Tylenol), Serum <10 (L) 10 - 30 ug/mL    Comment:        THERAPEUTIC CONCENTRATIONS VARY SIGNIFICANTLY. A RANGE OF 10-30 ug/mL MAY BE AN EFFECTIVE CONCENTRATION FOR MANY PATIENTS. HOWEVER, SOME ARE BEST TREATED AT CONCENTRATIONS OUTSIDE THIS RANGE. ACETAMINOPHEN CONCENTRATIONS >150 ug/mL AT 4 HOURS AFTER INGESTION AND >50 ug/mL AT 12 HOURS AFTER INGESTION ARE OFTEN ASSOCIATED WITH TOXIC REACTIONS.   Ethanol     Status: None   Collection Time: 12/03/16  9:45 PM  Result Value Ref Range   Alcohol, Ethyl (B) <5 <5 mg/dL    Comment:        LOWEST DETECTABLE LIMIT FOR SERUM ALCOHOL IS 5 mg/dL FOR MEDICAL PURPOSES ONLY   Salicylate level     Status: None   Collection Time: 12/03/16  9:45 PM  Result Value Ref Range   Salicylate Lvl <9.8 2.8 - 30.0 mg/dL  Comprehensive metabolic panel     Status: None   Collection Time: 12/03/16  9:45 PM  Result Value Ref Range   Sodium 143 135 - 145 mmol/L   Potassium 4.0 3.5 - 5.1 mmol/L    Comment: DELTA CHECK NOTED   Chloride 108 101 - 111 mmol/L   CO2 23 22 - 32 mmol/L  Glucose, Bld 93 65 - 99 mg/dL   BUN 6 6 - 20 mg/dL   Creatinine, Ser 0.63 0.44 - 1.00 mg/dL   Calcium 10.2 8.9 - 10.3 mg/dL   Total Protein 8.0 6.5 - 8.1 g/dL   Albumin 4.7 3.5 - 5.0 g/dL   AST 20 15 - 41 U/L   ALT 29 14 - 54 U/L   Alkaline Phosphatase 51 38 - 126 U/L   Total Bilirubin 0.3 0.3 - 1.2 mg/dL   GFR calc non Af Amer >60 >60 mL/min   GFR calc Af Amer >60 >60 mL/min    Comment: (NOTE) The eGFR has been calculated using the CKD EPI equation. This calculation has not been validated in all clinical situations. eGFR's  persistently <60 mL/min signify possible Chronic Kidney Disease.    Anion gap 12 5 - 15    Blood Alcohol level:  Lab Results  Component Value Date   ETH <5 12/03/2016   ETH <5 47/65/4650    Metabolic Disorder Labs:  No results found for: HGBA1C, MPG No results found for: PROLACTIN No results found for: CHOL, TRIG, HDL, CHOLHDL, VLDL, LDLCALC  Current Medications: Current Facility-Administered Medications  Medication Dose Route Frequency Provider Last Rate Last Dose  . acetaminophen (TYLENOL) tablet 650 mg  650 mg Oral Q6H PRN Ethelene Hal, NP      . alum & mag hydroxide-simeth (MAALOX/MYLANTA) 200-200-20 MG/5ML suspension 30 mL  30 mL Oral Q4H PRN Ethelene Hal, NP      . hydrOXYzine (ATARAX/VISTARIL) tablet 25 mg  25 mg Oral Q6H PRN Ethelene Hal, NP      . magnesium hydroxide (MILK OF MAGNESIA) suspension 30 mL  30 mL Oral Daily PRN Ethelene Hal, NP      . nicotine (NICODERM CQ - dosed in mg/24 hours) patch 14 mg  14 mg Transdermal Daily Jenne Campus, MD      . traZODone (DESYREL) tablet 50 mg  50 mg Oral QHS PRN Ethelene Hal, NP       PTA Medications: Prescriptions Prior to Admission  Medication Sig Dispense Refill Last Dose  . lidocaine (XYLOCAINE) 2 % solution Use as directed 15 mLs in the mouth or throat every 4 (four) hours as needed for mouth pain. (Patient not taking: Reported on 11/08/2014) 100 mL 0 Unknown at Unknown time    Musculoskeletal: Strength & Muscle Tone: within normal limits Gait & Station: normal Patient leans: N/A  Psychiatric Specialty Exam: Physical Exam  Review of Systems  Constitutional: Negative.   HENT: Negative.   Eyes: Negative.   Respiratory: Negative.   Cardiovascular: Negative.   Gastrointestinal: Negative.   Genitourinary: Negative.   Musculoskeletal: Negative.   Skin: Negative.   Neurological: Negative.   Endo/Heme/Allergies: Negative.   Psychiatric/Behavioral: Positive for suicidal  ideas.  All other systems reviewed and are negative.   Blood pressure 128/88, pulse 76, temperature 98.6 F (37 C), temperature source Oral, resp. rate 18, height 5' 7"  (1.702 m), weight 74.4 kg (164 lb), last menstrual period 11/23/2016.Body mass index is 25.69 kg/m.  General Appearance: Well Groomed  Eye Contact:  Good  Speech:  Normal Rate  Volume:  Normal  Mood:  states she is feeling better , minimizes depression  Affect:  Appropriate, slightly tearful when discussing her recent break up  Thought Process:  Linear and Descriptions of Associations: Intact  Orientation:  Full (Time, Place, and Person)  Thought Content:  no hallucinations,no delusions, not internally preoccupied  Suicidal Thoughts:  No denies any current suicidal ideations, denies any homicidal or violent ideations, contracts for safety on unit   Homicidal Thoughts:  No  Memory:  recent and remote grossly intact   Judgement:  Other:  improving   Insight:  fair , improving  Psychomotor Activity:  Normal  Concentration:  Concentration: Good and Attention Span: Good  Recall:  Good  Fund of Knowledge:  Good  Language:  Good  Akathisia:  Negative  Handed:  Right  AIMS (if indicated):     Assets:  Desire for Improvement Resilience  ADL's:  Intact  Cognition:  WNL  Sleep:  Number of Hours: 6.5    Treatment Plan Summary: Daily contact with patient to assess and evaluate symptoms and progress in treatment, Medication management, Plan inpatient treatment  and medications as below  Observation Level/Precautions:  15 minute checks  Laboratory:  as needed  TSH  Psychotherapy:  Milieu, group therapy   Medications:  We discussed options - patient reports fair sleep recently in relation to her stressor- she agrees to [place] trial for depression , insomnia   Consultations:  As needed   Discharge Concerns:  -    Estimated LOS: 4-5 days   Other:     Physician Treatment Plan for Primary Diagnosis: Suicidal Attempt   Long Term Goal(s): Improvement in symptoms so as ready for discharge  Short Term Goals: Ability to verbalize feelings will improve, Ability to disclose and discuss suicidal ideas, Ability to demonstrate self-control will improve, Ability to identify and develop effective coping behaviors will improve and Ability to maintain clinical measurements within normal limits will improve  Physician Treatment Plan for Secondary Diagnosis: Adjustment Disorder with Depressed Mood  Long Term Goal(s): Improvement in symptoms so as ready for discharge  Short Term Goals: Ability to disclose and discuss suicidal ideas, Ability to demonstrate self-control will improve, Ability to identify and develop effective coping behaviors will improve, Ability to maintain clinical measurements within normal limits will improve and Compliance with prescribed medications will improve  I certify that inpatient services furnished can reasonably be expected to improve the patient's condition.    Jenne Campus, MD 4/2/20181:25 PM

## 2016-12-05 NOTE — Progress Notes (Signed)
Patient ID: Audrey Reid, female   DOB: [birth year], 26 y.o.   MRN: [id number]  Patient signed a 72 hr request for DC at 1321 on 12/05/2016

## 2016-12-05 NOTE — [facility]HH Suicide Risk Assessment (Signed)
[facility] Admission Suicide Risk Assessment   Nursing information obtained from:    Demographic factors:  Adolescent or young adult, Audrey Reid, lesbian, or bisexual orientation, Living alone Current Mental Status:  Suicidal ideation indicated by patient Loss Factors:  Loss of significant relationship Historical Factors:  NA Risk Reduction Factors:  Employed, Positive social support  Total Time spent with patient: 45 minutes Principal Problem: Suicide attempt by drug ingestion (HCC) Diagnosis:   Patient Active Problem List   Diagnosis Date Noted  . Suicide attempt by drug ingestion (HCC) [T50.902A] 12/05/2016  . MDD (major depressive disorder), single episode, severe (HCC) [F32.2] 12/04/2016    Continued Clinical Symptoms:  Alcohol Use Disorder Identification Test Final Score (AUDIT): 1 The "Alcohol Use Disorders Identification Test", Guidelines for Use in Primary Care, Second Edition.  World Science writer [facility]). Score between 0-7:  no or low risk or alcohol related problems. Score between 8-15:  moderate risk of alcohol related problems. Score between 16-19:  high risk of alcohol related problems. Score 20 or above:  warrants further diagnostic evaluation for alcohol dependence and treatment.   CLINICAL FACTORS:  26 year old single female, status post serious overdose on NSAID ( Ibuprofen ) following break up with SO. Reports some neurovegetative symptoms leading up to suicidal attempt, but in general minimize depression, and states overdose was impulsive , unplanned    Psychiatric Specialty Exam: Physical Exam  ROS  Blood pressure 128/88, pulse 76, temperature 98.6 F (37 C), temperature source Oral, resp. rate 18, height  (1.702 m), weight 74.4 kg (164 lb), last menstrual period 11/23/2016.Body mass index is 25.69 kg/m.  See admit note MSE                                                         COGNITIVE FEATURES THAT CONTRIBUTE TO RISK:   Closed-mindedness and Loss of executive function    SUICIDE RISK:   Moderate:  Frequent suicidal ideation with limited intensity, and duration, some specificity in terms of plans, no associated intent, good self-control, limited dysphoria/symptomatology, some risk factors present, and identifiable protective factors, including available and accessible social support. Patient will be admitted to inpatient psychiatric unit for stabilization and safety. Will provide and encourage milieu participation. Provide medication management and maked adjustments as needed.  Will follow daily.   PLAN OF CARE: Patient will be admitted to inpatient psychiatric unit for stabilization and safety. Will provide and encourage milieu participation. Provide medication management and maked adjustments as needed.  Will follow daily.    I certify that inpatient services furnished can reasonably be expected to improve the patient's condition.   Craige Cotta, MD 12/05/2016, 1:48 PM

## 2016-12-05 NOTE — Progress Notes (Signed)
Recreation Therapy Notes  Date: 12/05/16 Time: 0930 Location: [address] Dayroom  Group Topic: Stress Management  Goal Area(s) Addresses:  Patient will verbalize importance of using healthy stress management.  Patient will identify positive emotions associated with healthy stress management.   Intervention: Stress Management  Activity :  LRT introduced the stress management technique of guided imagery.  LRT read Audrey Reid script to engage patients in guided imagery.  Patients were to follow along as the script was read in order to fully participate in the technique.  Education:  Stress Management, Discharge Planning.   Education Outcome: Acknowledges edcuation/In group clarification offered/Needs additional education  Clinical Observations/Feedback: Pt did not attend group.   Audrey Reid, LRT/CTRS         Audrey Reid Audrey Reid 12/05/2016 12:08 PM 

## 2016-12-05 NOTE — Progress Notes (Signed)
D: Pt denies SI/HI/AVH. Pt is pleasant and cooperative. Pt stated it was a stupid decision . Pt appears to be remorseful about what she did and seems sincere about getting help.   A: Pt was offered support and encouragement. Pt was given scheduled medications. Pt was encourage to attend groups. Q 15 minute checks were done for safety.   R:Pt attends groups and interacts well with peers and staff. Pt is taking medication. Pt has no complaints.Pt receptive to treatment and safety maintained on unit.

## 2016-12-05 NOTE — Progress Notes (Signed)
Adult Psychoeducational Group Note  Date:  12/05/2016 Time:  8:49 PM  Group Topic/Focus:  Wrap-Up Group:   The focus of this group is to help patients review their daily goal of treatment and discuss progress on daily workbooks.  Participation Level:  Active  Participation Quality:  Appropriate and Attentive  Affect:  Appropriate  Cognitive:  Appropriate  Insight: Appropriate  Engagement in Group:  Engaged  Modes of Intervention:  Discussion  Additional Comments:  Pt stated her goal was to feel better today than she did yesterday, which she accomplished. Pt stated something positive for today is she broke out of her shell a little bit. Pt went from not talking at all to anyone to talking to people.  Audrey Reid 12/05/2016, 8:49 PM

## 2016-12-05 NOTE — [facility] Group Notes (Signed)
[facility] LCSW Aftercare Discharge Planning Group Note   12/05/2016 12:27 PM  Participation Quality:  Active  Mood/Affect:  Irritable  Depression Rating: Pt denies   Anxiety Rating: Pt denies   Thoughts of Suicide:  No Will you contract for safety?   NA  Current AVH:  No  Plan for Discharge/Comments:  Pt states that she is feeling well today and would just like to get out of the hospital. "I don't like being here. This isn't for me". Pt requested to speak with CSW privately after group. CSW agreed.  Transportation Means: Family will transport    Supports: Family   Jonathon Jordan

## 2016-12-05 NOTE — Tx Team (Signed)
Interdisciplinary Treatment and Diagnostic Plan Update 12/05/2016 Time of Session: 9:30am  Audrey Reid  MRN: [id number]  Principal Diagnosis: Suicide attempt by drug ingestion [facility])  Secondary Diagnoses: Principal Problem:   Suicide attempt by drug ingestion [facility]) Active Problems:   MDD (major depressive disorder), single episode, severe ([place])   Current Medications:  Current Facility-Administered Medications  Medication Dose Route Frequency Provider Last Rate Last Dose  . acetaminophen (TYLENOL) tablet 650 mg  650 mg Oral Q6H PRN Ethelene Hal, NP      . alum & mag hydroxide-simeth (MAALOX/MYLANTA) 200-200-20 MG/5ML suspension 30 mL  30 mL Oral Q4H PRN Ethelene Hal, NP      . hydrOXYzine (ATARAX/VISTARIL) tablet 25 mg  25 mg Oral Q6H PRN Ethelene Hal, NP      . magnesium hydroxide (MILK OF MAGNESIA) suspension 30 mL  30 mL Oral Daily PRN Ethelene Hal, NP      . mirtazapine (REMERON) tablet 7.5 mg  7.5 mg Oral QHS Myer Peer Cobos, MD      . nicotine (NICODERM CQ - dosed in mg/24 hours) patch 14 mg  14 mg Transdermal Daily Jenne Campus, MD        PTA Medications: Prescriptions Prior to Admission  Medication Sig Dispense Refill Last Dose  . lidocaine (XYLOCAINE) 2 % solution Use as directed 15 mLs in the mouth or throat every 4 (four) hours as needed for mouth pain. (Patient not taking: Reported on 11/08/2014) 100 mL 0 Unknown at Unknown time    Treatment Modalities: Medication Management, Group therapy, Case management,  1 to 1 session with clinician, Psychoeducation, Recreational therapy.  Patient Stressors: Loss of relationship with her partner of 2 years Patient Strengths: Ability for insight Average or above average intelligence Capable of independent living Supportive family/friends  Physician Treatment Plan for Primary Diagnosis: Suicide attempt by drug ingestion ([place]) Long Term Goal(s): Improvement in symptoms so as ready for  discharge Short Term Goals: Ability to verbalize feelings will improve Ability to disclose and discuss suicidal ideas Ability to demonstrate self-control will improve Ability to identify and develop effective coping behaviors will improve Ability to maintain clinical measurements within normal limits will improve Ability to disclose and discuss suicidal ideas Ability to demonstrate self-control will improve Ability to identify and develop effective coping behaviors will improve Ability to maintain clinical measurements within normal limits will improve Compliance with prescribed medications will improve  Medication Management: Evaluate patient's response, side effects, and tolerance of medication regimen.  Therapeutic Interventions: 1 to 1 sessions, Unit Group sessions and Medication administration.  Evaluation of Outcomes: Not Met  Physician Treatment Plan for Secondary Diagnosis: Principal Problem:   Suicide attempt by drug ingestion ([place]) Active Problems:   MDD (major depressive disorder), single episode, severe ([place])  Long Term Goal(s): Improvement in symptoms so as ready for discharge  Short Term Goals: Ability to verbalize feelings will improve Ability to disclose and discuss suicidal ideas Ability to demonstrate self-control will improve Ability to identify and develop effective coping behaviors will improve Ability to maintain clinical measurements within normal limits will improve Ability to disclose and discuss suicidal ideas Ability to demonstrate self-control will improve Ability to identify and develop effective coping behaviors will improve Ability to maintain clinical measurements within normal limits will improve Compliance with prescribed medications will improve  Medication Management: Evaluate patient's response, side effects, and tolerance of medication regimen.  Therapeutic Interventions: 1 to 1 sessions, Unit Group sessions and Medication  administration.  Evaluation of Outcomes: Not Met  RN Treatment Plan for Primary Diagnosis: Suicide attempt by drug ingestion ([place]) Long Term Goal(s): Knowledge of disease and therapeutic regimen to maintain health will improve  Short Term Goals: Ability to verbalize feelings will improve and Compliance with prescribed medications will improve  Medication Management: RN will administer medications as ordered by provider, will assess and evaluate patient's response and provide education to patient for prescribed medication. RN will report any adverse and/or side effects to prescribing provider.  Therapeutic Interventions: 1 on 1 counseling sessions, Psychoeducation, Medication administration, Evaluate responses to treatment, Monitor vital signs and CBGs as ordered, Perform/monitor CIWA, COWS, AIMS and Fall Risk screenings as ordered, Perform wound care treatments as ordered.  Evaluation of Outcomes: Not Met  LCSW Treatment Plan for Primary Diagnosis: Suicide attempt by drug ingestion [facility]) Long Term Goal(s): Safe transition to appropriate next level of care at discharge, Engage patient in therapeutic group addressing interpersonal concerns. Short Term Goals: Engage patient in aftercare planning with referrals and resources, Increase ability to appropriately verbalize feelings, Increase emotional regulation, Identify triggers associated with mental health/substance abuse issues and Increase skills for wellness and recovery  Therapeutic Interventions: Assess for all discharge needs, 1 to 1 time with Social worker, Explore available resources and support systems, Assess for adequacy in community support network, Educate family and significant other(s) on suicide prevention, Complete Psychosocial Assessment, Interpersonal group therapy.  Evaluation of Outcomes: Not Met  Progress in Treatment: Attending groups: Pt is new to milieu, continuing to assess  Participating in groups: Pt is new to milieu,  continuing to assess  Taking medication as prescribed: Yes, MD continues to assess for medication changes as needed Toleration medication: Yes, no side effects reported at this time Family/Significant other contact made: No, CSW assessing for appropriate contact Patient understands diagnosis: Continuing to assess Discussing patient identified problems/goals with staff: Yes Medical problems stabilized or resolved: Yes Denies suicidal/homicidal ideation: No, pt recently admitted for suicidal ideation. Issues/concerns per patient self-inventory: None Other: N/A  New problem(s) identified: None identified at this time.   New Short Term/Long Term Goal(s): None identified at this time.   Discharge Plan or Barriers: Pt will return home and follow-up outpatient with [facility].  Reason for Continuation of Hospitalization:  Depression Medication stabilization Suicidal ideation  Estimated Length of Stay: 3-5 days  Attendees: Patient: 12/05/2016 3:09 PM  Physician: Dr. Parke Poisson 12/05/2016 3:09 PM  Nursing: Chrys Racer RN; [place], RN 12/05/2016 3:09 PM  RN Care Manager: Lars Pinks, RN 12/05/2016 3:09 PM  Social Worker: Matthew Saras, [place] 12/05/2016 3:09 PM  Recreational Therapist:  12/05/2016 3:09 PM  Other: Lindell Spar, NP; Samuel Jester, NP 12/05/2016 3:09 PM  Other:  12/05/2016 3:09 PM  Other: 12/05/2016 3:09 PM   Scribe for Treatment Team: Georga Kaufmann, MSW,LCSWA 12/05/2016 3:09 PM

## 2016-12-05 NOTE — B[facility] Group Notes (Signed)
B[facility] LCSW Group Therapy  12/05/2016 1:15pm  Type of Therapy: Group Therapy   Topic: Overcoming Obstacles  Participation Level: Active  Participation Quality: Appropriate   Affect: Appropriate  Cognitive: Appropriate and Oriented  Insight: Developing/Improving and Improving  Engagement in Therapy: Improving  Modes of Intervention: Discussion, Exploration, Problem-solving and Support  Description of Group:  In this group patients will be encouraged to explore what they see as obstacles to their own wellness and recovery. They will be guided to discuss their thoughts, feelings, and behaviors related to these obstacles. The group will process together ways to cope with barriers, with attention given to specific choices patients can make. Each patient will be challenged to identify changes they are motivated to make in order to overcome their obstacles. This group will be process-oriented, with patients participating in exploration of their own experiences as well as giving and receiving support and challenge from other group members.  Summary of Patient Progress:  Pt identifies her biggest obstacle as getting through her recent break up. Pt's plan for overcoming this obstacle is to reach out to her family and friends for support.  Therapeutic Modalities:  Cognitive Behavioral Therapy Solution Focused Therapy Motivational Interviewing Relapse Prevention Therapy  Jonathon Jordan, MSW, Theresia Majors [phone]

## 2016-12-05 NOTE — Progress Notes (Signed)
Nutrition Brief Note  Patient identified on the Malnutrition Screening Tool (MST) Report  Pt with weight gain. Pt is eating.  Wt Readings from Last 15 Encounters:  12/04/16 164 lb (74.4 kg)  12/03/16 164 lb (74.4 kg)  11/08/14 152 lb (68.9 kg)  08/31/14 154 lb (69.9 kg)  05/13/14 153 lb (69.4 kg)  04/04/13 135 lb (61.2 kg)  09/25/12 130 lb (59 kg) (54 %, Z= 0.09)*  07/08/12 130 lb (59 kg) (54 %, Z= 0.11)*  06/12/12 127 lb (57.6 kg) (49 %, Z= -0.03)*  04/24/12 129 lb (58.5 kg) (53 %, Z= 0.08)*  01/12/12 132 lb (59.9 kg) (60 %, Z= 0.25)*  10/31/11 125 lb (56.7 kg) (48 %, Z= -0.05)*  08/09/11 130 lb (59 kg) (58 %, Z= 0.21)*   * Growth percentiles are based on CDC 2-20 Years data.    Body mass index is 25.69 kg/m. Patient meets criteria for overweight based on current BMI.   Labs and medications reviewed.   No nutrition interventions warranted at this time. If nutrition issues arise, please consult RD.   Tilda Franco, MS, RD, LDN Pager: [phone] After Hours Pager: [phone]

## 2016-12-05 NOTE — Plan of Care (Signed)
Problem: Safety: Goal: Periods of time without injury will increase Outcome: Progressing Pt safe on the unit at this time   

## 2016-12-06 LAB — TSH: TSH: 1.917 u[IU]/mL (ref 0.350–4.500)

## 2016-12-06 NOTE — B[facility] Group Notes (Signed)
[facility] LCSW Group Therapy 12/06/2016 1:15 PM  Type of Therapy: Group Therapy- Feelings about Diagnosis  Participation Level: Active   Participation Quality:  Appropriate  Affect:  Appropriate  Cognitive: Alert and Oriented   Insight:  Developing   Engagement in Therapy: Developing/Improving and Engaged   Modes of Intervention: Clarification, Confrontation, Discussion, Education, Exploration, Limit-setting, Orientation, Problem-solving, Rapport Building, Dance movement psychotherapist, Socialization and Support  Description of Group:   This group will allow patients to explore their thoughts and feelings about diagnoses they have received. Patients will be guided to explore their level of understanding and acceptance of these diagnoses. Facilitator will encourage patients to process their thoughts and feelings about the reactions of others to their diagnosis, and will guide patients in identifying ways to discuss their diagnosis with significant others in their lives. This group will be process-oriented, with patients participating in exploration of their own experiences as well as giving and receiving support and challenge from other group members.  Summary of Progress/Problems:   Pt states that she was hesitant to open up to others in the past about her mental health concerns. However, pt recently told her family about what she has been going through and they have all been very supportive of her.  Therapeutic Modalities:   Cognitive Behavioral Therapy Solution Focused Therapy Motivational Interviewing Relapse Prevention Therapy  Jonathon Jordan, MSW, [facility] 12/06/2016 3:55 PM

## 2016-12-06 NOTE — BHH Group Notes (Signed)
Pt attended spiritual care group on grief and loss facilitated by chaplain Burnis Kingfisher   Group opened with brief discussion and psycho-social ed around grief and loss in relationships and in relation to self - identifying life patterns, circumstances, changes that cause losses. Established group norm of speaking from own life experience. Group goal of establishing open and affirming space for members to share loss and experience with grief, normalize grief experience and provide psycho social education and grief support.   Audrey Reid was present throughout group.  Engaged voluntarily in group discussion.  Spoke with other group members about grief around loss of relationship.  Stated that she had shifted in her experience of this grief since being present at [facility].  Found it helpful to have non-judgmental presence and feels that she is reclaiming some value in herself that she lost in this relationship.  Group provided normalization of Audrey Reid's feelings.  Audrey Reid provided support with other group members.     Audrey Reid MDiv

## 2016-12-06 NOTE — Progress Notes (Signed)
[facility] MD Progress Note  12/06/2016 4:05 PM Audrey Reid  MRN:  [id number] Subjective:  She reports she is feeling better, and reports being less focused on recent broken relationship. States "  I can't control what she wants or what she is going to do, I have to focus on myself ". Denies medication side effects. Denies any lingering or ongoing suicidal or self injurious ideations. States she had a good visit from her mother and other family members yesterday, and feels she has a good support network. As she improves she is focusing more on disposition planning options and states she is hoping to be discharged soon. Objective : I have discussed case with treatment team and have met with patient. Patient presents improved compared to admission, at this time minimizes depression, presents with fuller range of affect, and denies any lingering suicidal or self injurious ideations. No disruptive or agitated behaviors on unit. Denies medication side effects. Principal Problem: Suicide attempt by drug ingestion [facility]) Diagnosis:   Patient Active Problem List   Diagnosis Date Noted  . Suicide attempt by drug ingestion ([place]) [T50.902A] 12/05/2016  . MDD (major depressive disorder), single episode, severe ([place]) [F32.2] 12/04/2016   Total Time spent with patient: 20 minutes  Past Medical History:  Past Medical History:  Diagnosis Date  . Chronic back pain   . Chronic shoulder pain     Past Surgical History:  Procedure Laterality Date  . CYST EXCISION     Family History: History reviewed. No pertinent family history.  Social History:  History  Alcohol Use  . Yes    Comment: sometimes      History  Drug Use No    Social History   Social History  . Marital status: Single    Spouse name: N/A  . Number of children: N/A  . Years of education: N/A   Social History Main Topics  . Smoking status: Current Every Day Smoker    Packs/day: 0.50    Years: 2.00    Types: Cigarettes    Last  attempt to quit: 05/13/2013  . Smokeless tobacco: Never Used  . Alcohol use Yes     Comment: sometimes   . Drug use: No  . Sexual activity: Yes    Birth control/ protection: None   Other Topics Concern  . None   Social History Narrative  . None   Additional Social History:   Sleep: Good  Appetite:  Good  Current Medications: Current Facility-Administered Medications  Medication Dose Route Frequency Provider Last Rate Last Dose  . acetaminophen (TYLENOL) tablet 650 mg  650 mg Oral Q6H PRN Ethelene Hal, NP      . alum & mag hydroxide-simeth (MAALOX/MYLANTA) 200-200-20 MG/5ML suspension 30 mL  30 mL Oral Q4H PRN Ethelene Hal, NP      . hydrOXYzine (ATARAX/VISTARIL) tablet 25 mg  25 mg Oral Q6H PRN Ethelene Hal, NP      . magnesium hydroxide (MILK OF MAGNESIA) suspension 30 mL  30 mL Oral Daily PRN Ethelene Hal, NP      . mirtazapine (REMERON) tablet 7.5 mg  7.5 mg Oral QHS Myer Peer Cobos, MD   7.5 mg at 12/05/16 2121  . nicotine (NICODERM CQ - dosed in mg/24 hours) patch 14 mg  14 mg Transdermal Daily Jenne Campus, MD        Lab Results:  Results for orders placed or performed during the hospital encounter of 12/04/16 (from the past  48 hour(s))  TSH     Status: None   Collection Time: 12/06/16  6:07 AM  Result Value Ref Range   TSH 1.917 0.350 - 4.500 uIU/mL    Comment: Performed by a 3rd Generation assay with a functional sensitivity of <=0.01 uIU/mL. Performed at [facility], [place] [address] [place], [place] [address]     Blood Alcohol level:  Lab Results  Component Value Date   ETH <5 12/03/2016   ETH <5 60/45/4098    Metabolic Disorder Labs: No results found for: HGBA1C, MPG No results found for: PROLACTIN No results found for: CHOL, TRIG, HDL, CHOLHDL, VLDL, LDLCALC  Physical Findings: AIMS: Facial and Oral Movements Muscles of Facial Expression: None, normal Lips and Perioral Area: None, normal Jaw:  None, normal Tongue: None, normal,Extremity Movements Upper (arms, wrists, hands, fingers): None, normal Lower (legs, knees, ankles, toes): None, normal, Trunk Movements Neck, shoulders, hips: None, normal, Overall Severity Severity of abnormal movements (highest score from questions above): None, normal Incapacitation due to abnormal movements: None, normal Patient's awareness of abnormal movements (rate only patient's report): No Awareness, Dental Status Current problems with teeth and/or dentures?: No Does patient usually wear dentures?: No  CIWA:    COWS:     Musculoskeletal: Strength & Muscle Tone: within normal limits Gait & Station: normal Patient leans: N/A  Psychiatric Specialty Exam: Physical Exam  ROS denies headache, no chest pain , no shortness of breath, no vomiting   Blood pressure 107/88, pulse 71, temperature 98 F (36.7 C), temperature source Oral, resp. rate 18, height _0  (1.702 m), weight 74.4 kg (164 lb), last menstrual period 11/23/2016.Body mass index is 25.69 kg/m.  General Appearance: Well Groomed  Eye Contact:  Good  Speech:  Normal Rate  Volume:  Normal  Mood:  improved, and currently minimizes depression  Affect:  Appropriate and fuller in range   Thought Process:  Linear and Descriptions of Associations: Intact  Orientation:  Full (Time, Place, and Person)  Thought Content:  no hallucinations, no delusions, not internally preoccupied, less ruminative about recent break up  Suicidal Thoughts:  No at this time denies any suicidal or self injurious ideations, and denies any homicidal or violent ideations   Homicidal Thoughts:  No  Memory:  recent and remote grossly intact   Judgement:  Other:  improving   Insight:  improving   Psychomotor Activity:  Normal  Concentration:  Concentration: Good and Attention Span: Good  Recall:  Good  Fund of Knowledge:  Good  Language:  Good  Akathisia:  Negative  Handed:  Right  AIMS (if indicated):      Assets:  Communication Skills Desire for Improvement Physical Health Resilience  ADL's:  Intact  Cognition:  WNL  Sleep:  Number of Hours: 6.5   Assessment - patient presents improved compared to admission, with improved mood and range of affect, no current suicidal ideations. She is less ruminative about recent break up. Behavior on unit calm, in good control.   Treatment Plan Summary: Daily contact with patient to assess and evaluate symptoms and progress in treatment, Medication management, Plan inpatient treatment  and medications as below Encourage ongoing group and milieu participation to work on coping skills and symptom reduction Continue Remeron 7.5 mgrs QHS for depression, anxiety, insomnia Continue Vistaril 25 mgrs Q 6 hours PRN for anxiety  Treatment team working on disposition planning options  Jenne Campus, MD 12/06/2016, 4:05 PM

## 2016-12-06 NOTE — Progress Notes (Signed)
Provider alerted that pt wanted to move rooms due to trouble sleeping at night because of snoring roommate. Provider approved.

## 2016-12-06 NOTE — Progress Notes (Signed)
D: Pt presents with flat affect. Pt irritable on approach due to lack of sleep last night.  Pt denies depression and anxiety. Pt denies suicidal thoughts. Pt stated that she feels much better compared to yesterday.  A: Orders reviewed with pt. Verbal support provided. Pt encouraged to attend groups. 15 minute checks performed for safety.  R: Pt compliant with tx.

## 2016-12-06 NOTE — BHH Group Notes (Signed)
The focus of this group is to educate the patient on the purpose and policies of crisis stabilization and provide a format to answer questions about their admission.  The group details unit policies and expectations of patients while admitted.  Patient attended 0900 nurse education orientation group this morning.  Patient actively participated and had appropriate affect.  Patient was alert.  Patient had appropriate inisight and appropriate engagement.  Today patient will work on 3 goals for discharge.   

## 2016-12-06 NOTE — BHH Suicide Risk Assessment (Signed)
[facility] INPATIENT:  Family/Significant Other Suicide Prevention Education  Suicide Prevention Education:  Contact Attempts: October Peery (mom [phone]), has been identified by the patient as the family member/significant other with whom the patient will be residing, and identified as the person(s) who will aid the patient in the event of a mental health crisis.  With written consent from the patient, two attempts were made to provide suicide prevention education, prior to and/or following the patient's discharge.  We were unsuccessful in providing suicide prevention education.  A suicide education pamphlet was given to the patient to share with family/significant other.  Date and time of first attempt: 12/06/16 @ 12:05pm. No answer, left a message.  Jonathon Jordan, MSW, LCSWA 12/06/2016, 12:08 PM

## 2016-12-06 NOTE — Progress Notes (Signed)
Pt attend group. Her day was a 10. Her goal was to feel better today than she did yesterday. Pt said she's trying to take one day at a time.

## 2016-12-07 DIAGNOSIS — F4321 Adjustment disorder with depressed mood: Secondary | ICD-10-CM

## 2016-12-07 MED ORDER — HYDROXYZINE HCL 25 MG PO TABS: 25.0000 mg | ORAL_TABLET | Freq: Four times a day (QID) | ORAL | 0 refills | Status: DC | PRN

## 2016-12-07 MED ORDER — NICOTINE 14 MG/24HR TD PT24: 14.0000 mg | MEDICATED_PATCH | Freq: Every day | TRANSDERMAL | 0 refills | Status: AC

## 2016-12-07 MED ORDER — MIRTAZAPINE 7.5 MG PO TABS: 7.5000 mg | ORAL_TABLET | Freq: Every day | ORAL | 0 refills | Status: AC

## 2016-12-07 NOTE — Progress Notes (Signed)
Discharge note: Pt received both written and verbal discharge instructions. Pt verbalized understanding of discharge instructions. Pt agreed to f/u appt and med regimen. Pt received prescriptions, AVS, SRA and transitional record. Pt received belongings from room and locker. Pt safely discharged to lobby.

## 2016-12-07 NOTE — Progress Notes (Signed)
  [facility] Adult Case Management Discharge Plan :  Will you be returning to the same living situation after discharge:  Yes,  pt returning home. At discharge, do you have transportation home?: Yes,  pt's mother will transport. Do you have the ability to pay for your medications: Yes,  prescriptions and samples provided.  Release of information consent forms completed and in the chart;  Patient's signature needed at discharge.  Patient to Follow up at: Follow-up Information    [facility]. Go on 12/09/2016.   Why:  Hospital follow-up appointment at [facility]. Please bring a photo ID, insurance card, proof of income, and social security card withh you to your appointment.  Contact information: [address] [place] [address] [phone]           Next level of care provider has access to [facility] Link:no  Safety Planning and Suicide Prevention discussed: Yes,  with pt and with pt's mother.  Have you used any form of tobacco in the last 30 days? (Cigarettes, Smokeless Tobacco, Cigars, and/or Pipes): Yes  Has patient been referred to the Quitline?: Patient refused referral  Patient has been referred for addiction treatment: N/A  Jonathon Jordan 12/07/2016, 10:35 AM

## 2016-12-07 NOTE — [facility] Suicide Risk Assessment (Signed)
[facility] INPATIENT:  Family/Significant Other Suicide Prevention Education  Suicide Prevention Education:  Education Completed; Lavada Langsam (mom [phone]), has been identified by the patient as the family member/significant other with whom the patient will be residing, and identified as the person(s) who will aid the patient in the event of a mental health crisis (suicidal ideations/suicide attempt).  With written consent from the patient, the family member/significant other has been provided the following suicide prevention education, prior to the and/or following the discharge of the patient.  The suicide prevention education provided includes the following:  Suicide risk factors  Suicide prevention and interventions  National Suicide Hotline telephone number  [facility] assessment telephone number  [facility] Emergency Assistance 911  [facility] and/or Residential Mobile Crisis Unit telephone number  Request made of family/significant other to:  Remove weapons (e.g., guns, rifles, knives), all items previously/currently identified as safety concern.    Remove drugs/medications (over-the-counter, prescriptions, illicit drugs), all items previously/currently identified as a safety concern.  The family member/significant other verbalizes understanding of the suicide prevention education information provided.  The family member/significant other agrees to remove the items of safety concern listed above.  Jonathon Jordan 12/07/2016, 10:40 AM

## 2016-12-07 NOTE — Progress Notes (Signed)
Nursing Progress Note: 7p-7a D: Pt currently presents with a pleasant affect and behavior. Pt states "Today is good. I'm ready to go." Interacting appropriately with milieu. Pt reports good sleep with current medication regimen.   A: Pt provided with medications per providers orders. Pt's labs and vitals were monitored throughout the night. Pt supported emotionally and encouraged to express concerns and questions. Pt educated on medications.  R: Pt's safety ensured with 15 minute and environmental checks. Pt currently denies SI/HI/Self Harm and AVH. Pt verbally contracts to seek staff if SI/HI or A/VH occurs and to consult with staff before acting on any harmful thoughts. Will continue to monitor.

## 2016-12-07 NOTE — Progress Notes (Signed)
Recreation Therapy Notes  Date: 12/07/16 Time: 0930 Location: [address] Dayroom  Group Topic: Stress Management  Goal Area(s) Addresses:  Patient will verbalize importance of using healthy stress management.  Patient will identify positive emotions associated with healthy stress management.   Intervention: Stress Management  Activity :  Progressive Muscle Relaxation.  LRT introduced the stress management technique of progressive muscle relaxation.  LRT read a script to allow patients to tense and relax each muscle group individually.  Patients were to follow along as LRT read script to engage in activity.  Education:  Stress Management, Discharge Planning.   Education Outcome: Acknowledges edcuation/In group clarification offered/Needs additional education  Clinical Observations/Feedback: Pt did not attend group.   Caroll Rancher, LRT/CTRS         Caroll Rancher A 12/07/2016 12:24 PM

## 2016-12-07 NOTE — [facility] Suicide Risk Assessment (Addendum)
[facility] Discharge Suicide Risk Assessment   Principal Problem: Suicide attempt by drug ingestion [facility]) Discharge Diagnoses:  Patient Active Problem List   Diagnosis Date Noted  . Suicide attempt by drug ingestion (HCC) [T50.902A] 12/05/2016  . MDD (major depressive disorder), single episode, severe (HCC) [F32.2] 12/04/2016    Total Time spent with patient: 30 minutes  Musculoskeletal: Strength & Muscle Tone: within normal limits Gait & Station: normal Patient leans: N/A  Psychiatric Specialty Exam: ROS Denies headache, no chest pain, no shortness of breath, no vomiting    Blood pressure 119/84, pulse 67, temperature 97.5 F (36.4 C), temperature source Oral, resp. rate 18, height  (1.702 m), weight 74.4 kg (164 lb), last menstrual period 11/23/2016.Body mass index is 25.69 kg/m.  General Appearance: Well Groomed  Eye Contact::  Good  Speech:  Normal Rate409  Volume:  Normal  Mood:  improved, currently euthymic  Affect:  Appropriate and Full Range  Thought Process:  Goal Directed, Linear and Descriptions of Associations: Intact  Orientation:  Full (Time, Place, and Person)  Thought Content:  no hallucinations, no delusions, not internally preoccupied   Suicidal Thoughts:  No denies any suicidal or self injurious ideations, denies any homicidal or violent ideations, and also specifically denies any homicidal ideations towards Ex GF   Homicidal Thoughts:  No  Memory:  recent and remote grossly intact   Judgement:  Other:  improved  Insight:  improved   Psychomotor Activity:  Normal  Concentration:  Good  Recall:  Good  Fund of Knowledge:Good  Language: Good  Akathisia:  Negative  Handed:  Right  AIMS (if indicated):     Assets:  Communication Skills Desire for Improvement Resilience  Sleep:  Number of Hours: 6.5  Cognition: WNL  ADL's:  Intact   Mental Status Per Nursing Assessment::   On Admission:  Suicidal ideation indicated by patient  Demographic Factors:   26 year old single female, employed, plans to live with her mother for the next several days to weeks, for added social support   Loss Factors: Recent break up   Historical Factors: No prior history of psychiatric admissions or of suicide attempts   Risk Reduction Factors:   Sense of responsibility to family, Living with another person, especially a relative, Positive social support and Positive coping skills or problem solving skills  Continued Clinical Symptoms:  At this time is alert , attentive, well related, pleasant, mood is described as improved and currently presents euthymic, with full range of affect, no thought disorder, no suicidal or homicidal ideations, no psychotic symptoms, future oriented . Denies medication side effects. Medication side effects reviewed  Behavior on unit in good control.  Cognitive Features That Contribute To Risk:  No gross cognitive deficits noted upon discharge. Is alert , attentive, and oriented x 3    Suicide Risk:  Mild:  Suicidal ideation of limited frequency, intensity, duration, and specificity.  There are no identifiable plans, no associated intent, mild dysphoria and related symptoms, good self-control (both objective and subjective assessment), few other risk factors, and identifiable protective factors, including available and accessible social support.  Follow-up Information    [facility]. Go on 12/09/2016.   Why:  Hospital follow-up appointment at [facility]. Please bring a photo ID, insurance card, proof of income, and social security card withh you to your appointment.  Contact information: [address] [place] [address] [phone]           Plan Of Care/Follow-up recommendations:  Activity:  as tolerated  Diet:  Regular Tests:  NA Other:  See below  Patient is leaving unit in good spirits  Plans to go live with her mother for a period of time after discharge Plans to follow up as above   Craige Cotta,  MD 12/07/2016, 10:23 AM

## 2016-12-07 NOTE — Tx Team (Signed)
Interdisciplinary Treatment and Diagnostic Plan Update 12/07/2016 Time of Session: 9:30am  Audrey Reid  MRN: [id number]  Principal Diagnosis: Suicide attempt by drug ingestion [facility])  Secondary Diagnoses: Principal Problem:   Suicide attempt by drug ingestion [facility]) Active Problems:   MDD (major depressive disorder), single episode, severe (HCC)   Adjustment disorder with depressed mood   Current Medications:  Current Facility-Administered Medications  Medication Dose Route Frequency Provider Last Rate Last Dose  . acetaminophen (TYLENOL) tablet 650 mg  650 mg Oral Q6H PRN Laveda Abbe, NP      . alum & mag hydroxide-simeth (MAALOX/MYLANTA) 200-200-20 MG/5ML suspension 30 mL  30 mL Oral Q4H PRN Laveda Abbe, NP      . hydrOXYzine (ATARAX/VISTARIL) tablet 25 mg  25 mg Oral Q6H PRN Laveda Abbe, NP      . magnesium hydroxide (MILK OF MAGNESIA) suspension 30 mL  30 mL Oral Daily PRN Laveda Abbe, NP      . mirtazapine (REMERON) tablet 7.5 mg  7.5 mg Oral QHS Craige Cotta, MD   7.5 mg at 12/06/16 2119  . nicotine (NICODERM CQ - dosed in mg/24 hours) patch 14 mg  14 mg Transdermal Daily Craige Cotta, MD        PTA Medications: Prescriptions Prior to Admission  Medication Sig Dispense Refill Last Dose  . lidocaine (XYLOCAINE) 2 % solution Use as directed 15 mLs in the mouth or throat every 4 (four) hours as needed for mouth pain. (Patient not taking: Reported on 11/08/2014) 100 mL 0 Unknown at Unknown time    Treatment Modalities: Medication Management, Group therapy, Case management,  1 to 1 session with clinician, Psychoeducation, Recreational therapy.  Patient Stressors: Loss of relationship with her partner of 2 years Patient Strengths: Ability for insight Average or above average intelligence Capable of independent living Supportive family/friends  Physician Treatment Plan for Primary Diagnosis: Suicide attempt by drug ingestion  (HCC) Long Term Goal(s): Improvement in symptoms so as ready for discharge Short Term Goals: Ability to verbalize feelings will improve Ability to disclose and discuss suicidal ideas Ability to demonstrate self-control will improve Ability to identify and develop effective coping behaviors will improve Ability to maintain clinical measurements within normal limits will improve Ability to disclose and discuss suicidal ideas Ability to demonstrate self-control will improve Ability to identify and develop effective coping behaviors will improve Ability to maintain clinical measurements within normal limits will improve Compliance with prescribed medications will improve  Medication Management: Evaluate patient's response, side effects, and tolerance of medication regimen.  Therapeutic Interventions: 1 to 1 sessions, Unit Group sessions and Medication administration.  Evaluation of Outcomes: Adequate for Discharge  Physician Treatment Plan for Secondary Diagnosis: Principal Problem:   Suicide attempt by drug ingestion (HCC) Active Problems:   MDD (major depressive disorder), single episode, severe (HCC)   Adjustment disorder with depressed mood  Long Term Goal(s): Improvement in symptoms so as ready for discharge  Short Term Goals: Ability to verbalize feelings will improve Ability to disclose and discuss suicidal ideas Ability to demonstrate self-control will improve Ability to identify and develop effective coping behaviors will improve Ability to maintain clinical measurements within normal limits will improve Ability to disclose and discuss suicidal ideas Ability to demonstrate self-control will improve Ability to identify and develop effective coping behaviors will improve Ability to maintain clinical measurements within normal limits will improve Compliance with prescribed medications will improve  Medication Management: Evaluate patient's response, side effects, and  tolerance  of medication regimen.  Therapeutic Interventions: 1 to 1 sessions, Unit Group sessions and Medication administration.  Evaluation of Outcomes: Adequate for Discharge  RN Treatment Plan for Primary Diagnosis: Suicide attempt by drug ingestion (HCC) Long Term Goal(s): Knowledge of disease and therapeutic regimen to maintain health will improve  Short Term Goals: Ability to verbalize feelings will improve and Compliance with prescribed medications will improve  Medication Management: RN will administer medications as ordered by provider, will assess and evaluate patient's response and provide education to patient for prescribed medication. RN will report any adverse and/or side effects to prescribing provider.  Therapeutic Interventions: 1 on 1 counseling sessions, Psychoeducation, Medication administration, Evaluate responses to treatment, Monitor vital signs and CBGs as ordered, Perform/monitor CIWA, COWS, AIMS and Fall Risk screenings as ordered, Perform wound care treatments as ordered.  Evaluation of Outcomes: Adequate for Discharge  LCSW Treatment Plan for Primary Diagnosis: Suicide attempt by drug ingestion [facility]) Long Term Goal(s): Safe transition to appropriate next level of care at discharge, Engage patient in therapeutic group addressing interpersonal concerns. Short Term Goals: Engage patient in aftercare planning with referrals and resources, Increase ability to appropriately verbalize feelings, Increase emotional regulation, Identify triggers associated with mental health/substance abuse issues and Increase skills for wellness and recovery  Therapeutic Interventions: Assess for all discharge needs, 1 to 1 time with Social worker, Explore available resources and support systems, Assess for adequacy in community support network, Educate family and significant other(s) on suicide prevention, Complete Psychosocial Assessment, Interpersonal group therapy.  Evaluation of Outcomes:  Adequate for Discharge  Progress in Treatment: Attending groups: Yes Participating in groups: Yes  Taking medication as prescribed: Yes, MD continues to assess for medication changes as needed Toleration medication: Yes, no side effects reported at this time Family/Significant other contact made: Yes, pt's mother contacted. Patient understands diagnosis: Yes, AEB pt's willingness to participate in treatment. Discussing patient identified problems/goals with staff: Yes Medical problems stabilized or resolved: Yes Denies suicidal/homicidal ideation: Yes Issues/concerns per patient self-inventory: None Other: N/A  New problem(s) identified: None identified at this time.   New Short Term/Long Term Goal(s): None identified at this time.   Discharge Plan or Barriers: Pt will return home and follow-up outpatient with [facility].  Reason for Continuation of Hospitalization:  None identified at this time.  Estimated Length of Stay: 0 days  Attendees: Patient: 12/07/2016 10:47 AM  Physician: Dr. Jama Flavors 12/07/2016 10:47 AM  Nursing: Rodell Perna RN; [place], RN 12/07/2016 10:47 AM  RN Care Manager: Onnie Boer, RN 12/07/2016 10:47 AM  Social Worker: Donnelly Stager, LCSWA 12/07/2016 10:47 AM  Recreational Therapist:  12/07/2016 10:47 AM  Other: Armandina Stammer, NP; Gray Bernhardt, NP 12/07/2016 10:47 AM  Other:  12/07/2016 10:47 AM  Other: 12/07/2016 10:47 AM   Scribe for Treatment Team: Jonathon Jordan, MSW,LCSWA 12/07/2016 10:47 AM

## 2016-12-07 NOTE — Discharge Summary (Signed)
Physician Discharge Summary Note  Patient:  Audrey Reid is an 26 y.o., female MRN:  [id number] DOB:  Nov 12, 1990 Patient phone:  [phone] (home)  Patient address:   [address] [place] [address],  Total Time spent with patient: 30 minutes  Date of Admission:  12/04/2016 Date of Discharge: 12/07/2016  Reason for Admission:  Suicide by overdose  Principal Problem: Suicide attempt by drug ingestion [facility]) Discharge Diagnoses: Patient Active Problem List   Diagnosis Date Noted  . Adjustment disorder with depressed mood [F43.21]   . Suicide attempt by drug ingestion (HCC) [T50.902A] 12/05/2016  . MDD (major depressive disorder), single episode, severe (HCC) [F32.2] 12/04/2016    Past Psychiatric History:  See HPI  Past Medical History:  Past Medical History:  Diagnosis Date  . Chronic back pain   . Chronic shoulder pain     Past Surgical History:  Procedure Laterality Date  . CYST EXCISION     Family History: History reviewed. No pertinent family history. Family Psychiatric  History: see HPI Social History:  History  Alcohol Use  . Yes    Comment: sometimes      History  Drug Use No    Social History   Social History  . Marital status: Single    Spouse name: N/A  . Number of children: N/A  . Years of education: N/A   Social History Main Topics  . Smoking status: Current Every Day Smoker    Packs/day: 0.50    Years: 2.00    Types: Cigarettes    Last attempt to quit: 05/13/2013  . Smokeless tobacco: Never Used  . Alcohol use Yes     Comment: sometimes   . Drug use: No  . Sexual activity: Yes    Birth control/ protection: None   Other Topics Concern  . None   Social History Narrative  . None    Hospital Course:  Audrey Reid, 26 year old female, who impulsively overdosed on NSAID (Ibuprofen) . States " I did take a lot of them", and chart notes indicated there were 124 tablets missing. This occurred on 3/31.  Patient stated overdose was  impulsive, and related to breaking up with her SO a few days earlier, and attempting unsuccessfully to repair the relationship.  Audrey Reid was admitted for Suicide attempt by drug ingestion [facility]) and crisis management.  Patient was treated with medications with their indications listed below in detail under Medication List.  Medical problems were identified and treated as needed.  Home medications were restarted as appropriate.  Improvement was monitored by observation and Audrey Reid daily report of symptom reduction.  Emotional and mental status was monitored by daily self inventory reports completed by Audrey Reid and clinical staff.  Patient reported continued improvement, denied any new concerns.  Patient had been compliant on medications and denied side effects.  Support and encouragement was provided.    Patient encouraged to attend groups to help with recognizing triggers of emotional crises and de-stabilizations.  Patient encouraged to attend group to help identify the positive things in life that would help in dealing with feelings of loss, depression and unhealthy or abusive tendencies.         Audrey Reid was evaluated by the treatment team for stability and plans for continued recovery upon discharge.  Patient was offered further treatment options upon discharge including Residential, Intensive Outpatient and Outpatient treatment. Patient will follow up with agency listed below for medication management and  counseling.  Encouraged patient to maintain satisfactory support network and home environment.  Advised to adhere to medication compliance and outpatient treatment follow up.  Prescriptions provided.       Audrey Reid motivation was an integral factor for scheduling further treatment.  Employment, transportation, bed availability, health status, family support, and any pending legal issues were also considered during patient's hospital stay.  Upon completion of  this admission the patient was both mentally and medically stable for discharge denying suicidal/homicidal ideation, auditory/visual/tactile hallucinations, delusional thoughts and paranoia.      Physical Findings: AIMS: Facial and Oral Movements Muscles of Facial Expression: None, normal Lips and Perioral Area: None, normal Jaw: None, normal Tongue: None, normal,Extremity Movements Upper (arms, wrists, hands, fingers): None, normal Lower (legs, knees, ankles, toes): None, normal, Trunk Movements Neck, shoulders, hips: None, normal, Overall Severity Severity of abnormal movements (highest score from questions above): None, normal Incapacitation due to abnormal movements: None, normal Patient's awareness of abnormal movements (rate only patient's report): No Awareness, Dental Status Current problems with teeth and/or dentures?: No Does patient usually wear dentures?: No  CIWA:    COWS:     Musculoskeletal: Strength & Muscle Tone: within normal limits Gait & Station: normal Patient leans: N/A  Psychiatric Specialty Exam:  SEE MD SRA Physical Exam  Nursing note and vitals reviewed.   ROS  Blood pressure 119/84, pulse 67, temperature 97.5 F (36.4 C), temperature source Oral, resp. rate 18, height  (1.702 m), weight 74.4 kg (164 lb), last menstrual period 11/23/2016.Body mass index is 25.69 kg/m.    Have you used any form of tobacco in the last 30 days? (Cigarettes, Smokeless Tobacco, Cigars, and/or Pipes): Yes  Has this patient used any form of tobacco in the last 30 days? (Cigarettes, Smokeless Tobacco, Cigars, and/or Pipes) Yes, N/A  Blood Alcohol level:  Lab Results  Component Value Date   ETH <5 12/03/2016   ETH <5 12/03/2016    Metabolic Disorder Labs:  No results found for: HGBA1C, MPG No results found for: PROLACTIN No results found for: CHOL, TRIG, HDL, CHOLHDL, VLDL, LDLCALC  See Psychiatric Specialty Exam and Suicide Risk Assessment completed by Attending  Physician prior to discharge.  Discharge destination:  Home  Is patient on multiple antipsychotic therapies at discharge:  No   Has Patient had three or more failed trials of antipsychotic monotherapy by history:  No  Recommended Plan for Multiple Antipsychotic Therapies: NA   Allergies as of 12/07/2016   No Known Allergies     Medication List    STOP taking these medications   lidocaine 2 % solution Commonly known as:  XYLOCAINE     TAKE these medications     Indication  hydrOXYzine 25 MG tablet Commonly known as:  ATARAX/VISTARIL Take 1 tablet (25 mg total) by mouth every 6 (six) hours as needed for anxiety.  Indication:  Anxiety Neurosis   mirtazapine 7.5 MG tablet Commonly known as:  REMERON Take 1 tablet (7.5 mg total) by mouth at bedtime.  Indication:  Major Depressive Disorder   nicotine 14 mg/24hr patch Commonly known as:  NICODERM CQ - dosed in mg/24 hours Place 1 patch (14 mg total) onto the skin daily. Start taking on:  12/08/2016  Indication:  Nicotine Addiction      Follow-up Information    [facility]. Go on 12/09/2016.   Why:  Hospital follow-up appointment at [facility]. Please bring a photo ID, insurance card, proof of income, and social  security card withh you to your appointment.  Contact information: [address] [place] [address] [phone]           Follow-up recommendations:  Activity:  as tol Diet:  as tol  Comments:  1.  Take all your medications as prescribed.   2.  Report any adverse side effects to outpatient provider. 3.  Patient instructed to not use alcohol or illegal drugs while on prescription medicines. 4.  In the event of worsening symptoms, instructed patient to call 911, the crisis hotline or go to nearest emergency room for evaluation of symptoms.  Signed: Lindwood Qua, NP [facility] 12/07/2016, 10:44 AM   Patient seen, Suicide Assessment Completed.  Disposition Plan Reviewed

## 2017-08-08 ENCOUNTER — Emergency Department (HOSPITAL_COMMUNITY)
Admission: EM | Admit: 2017-08-08 | Discharge: 2017-08-08 | Disposition: A | Discharge status: Home / Self Care | Payer: 59 | Attending: Emergency Medicine | Admitting: Emergency Medicine

## 2017-08-08 ENCOUNTER — Other Ambulatory Visit: Payer: Self-pay

## 2017-08-08 ENCOUNTER — Encounter (HOSPITAL_COMMUNITY): Payer: Self-pay | Admitting: *Deleted

## 2017-08-08 DIAGNOSIS — H9201 Otalgia, right ear: Secondary | ICD-10-CM

## 2017-08-08 DIAGNOSIS — F17210 Nicotine dependence, cigarettes, uncomplicated: Secondary | ICD-10-CM | POA: Insufficient documentation

## 2017-08-08 DIAGNOSIS — R070 Pain in throat: Secondary | ICD-10-CM | POA: Insufficient documentation

## 2017-08-08 DIAGNOSIS — J069 Acute upper respiratory infection, unspecified: Secondary | ICD-10-CM | POA: Insufficient documentation

## 2017-08-08 MED ORDER — IBUPROFEN 400 MG PO TABS
400.0000 mg | ORAL_TABLET | Freq: Once | ORAL | Status: AC
  Administered 2017-08-08: 400 mg via ORAL
  Filled 2017-08-08: qty 1

## 2017-08-08 MED ORDER — ACETAMINOPHEN 325 MG PO TABS
650.0000 mg | ORAL_TABLET | Freq: Once | ORAL | Status: AC
  Administered 2017-08-08: 650 mg via ORAL
  Filled 2017-08-08: qty 2

## 2017-08-08 MED ORDER — PSEUDOEPHEDRINE HCL 60 MG PO TABS
60.0000 mg | ORAL_TABLET | Freq: Once | ORAL | Status: AC
  Administered 2017-08-08: 60 mg via ORAL
  Filled 2017-08-08: qty 1

## 2017-08-08 NOTE — ED Triage Notes (Signed)
Pt reports right ear pain. Pt states she had a sore throat last week but denies any other symptoms.

## 2017-08-08 NOTE — ED Provider Notes (Signed)
[facility] EMERGENCY DEPARTMENT Provider Note   CSN: [id number] Arrival date & time: 08/08/17  2100     History   Chief Complaint Chief Complaint  Patient presents with  . Otalgia    HPI Audrey Reid is a 26 y.o. female.  Patient is a 26 year old female who presents to the emergency department with complaint of right ear pain.  Patient states that over the past 5 or 6 days she has been having problems with sore throat.  She has not had any high fever to be reported.  No unusual rash noted.  She has some pain in her right ear.  This seems to be slow to resolve, so she came to the emergency department for additional evaluation.        Past Medical History:  Diagnosis Date  . Chronic back pain   . Chronic shoulder pain     Patient Active Problem List   Diagnosis Date Noted  . Adjustment disorder with depressed mood   . Suicide attempt by drug ingestion (HCC) 12/05/2016  . MDD (major depressive disorder), single episode, severe (HCC) 12/04/2016    Past Surgical History:  Procedure Laterality Date  . CYST EXCISION      OB History    Gravida Para Term Preterm AB Living             0   SAB TAB Ectopic Multiple Live Births                   Home Medications    Prior to Admission medications   Medication Sig Start Date End Date Taking? Authorizing Provider  hydrOXYzine (ATARAX/VISTARIL) 25 MG tablet Take 1 tablet (25 mg total) by mouth every 6 (six) hours as needed for anxiety. Patient not taking: Reported on 08/08/2017 12/07/16   Adonis BrookAgustin, Sheila, NP  mirtazapine (REMERON) 7.5 MG tablet Take 1 tablet (7.5 mg total) by mouth at bedtime. Patient not taking: Reported on 08/08/2017 12/07/16   Adonis BrookAgustin, Sheila, NP  nicotine (NICODERM CQ - DOSED IN MG/24 HOURS) 14 mg/24hr patch Place 1 patch (14 mg total) onto the skin daily. Patient not taking: Reported on 08/08/2017 12/08/16   Adonis BrookAgustin, Sheila, NP    Family History History reviewed. No pertinent family  history.  Social History Social History   Tobacco Use  . Smoking status: Current Every Day Smoker    Packs/day: 0.50    Years: 2.00    Pack years: 1.00    Types: Cigarettes    Last attempt to quit: 05/13/2013    Years since quitting: 4.2  . Smokeless tobacco: Never Used  Substance Use Topics  . Alcohol use: Yes    Comment: sometimes   . Drug use: No     Allergies   Patient has no known allergies.   Review of Systems Review of Systems  Constitutional: Negative for activity change, chills and fever.       All ROS Neg except as noted in HPI  HENT: Positive for ear pain, postnasal drip and sore throat. Negative for nosebleeds.   Eyes: Negative for photophobia and discharge.  Respiratory: Negative for cough, shortness of breath and wheezing.   Cardiovascular: Negative for chest pain and palpitations.  Gastrointestinal: Negative for abdominal pain and blood in stool.  Genitourinary: Negative for dysuria, frequency and hematuria.  Musculoskeletal: Negative for arthralgias, back pain and neck pain.  Skin: Negative.   Neurological: Negative for dizziness, seizures and speech difficulty.  Psychiatric/Behavioral: Negative for confusion and hallucinations.  Physical Exam Updated Vital Signs BP 129/75 (BP Location: Right Arm)   Pulse 75   Temp 98.3 F (36.8 C) (Oral)   Resp 18   Ht 5\' 7"  (1.702 m)   Wt 70.3 kg (155 lb)   LMP 07/23/2017   SpO2 100%   BMI 24.28 kg/m   Physical Exam  Constitutional: She is oriented to person, place, and time. She appears well-developed and well-nourished.  Non-toxic appearance.  HENT:  Head: Normocephalic.  Right Ear: Tympanic membrane and external ear normal.  Left Ear: Tympanic membrane and external ear normal.  There is mild nasal congestion present.  There is fluid behind the drum on the right.  The left TM is within normal limits.  There is no redness, or swelling of the mastoid on the right or the left.    Eyes: EOM and lids  are normal. Pupils are equal, round, and reactive to light.  Neck: Normal range of motion. Neck supple. Carotid bruit is not present.  Cardiovascular: Normal rate, regular rhythm, normal heart sounds, intact distal pulses and normal pulses.  Pulmonary/Chest: Breath sounds normal. No respiratory distress.  Abdominal: Soft. Bowel sounds are normal. There is no tenderness. There is no guarding.  Musculoskeletal: Normal range of motion.  Lymphadenopathy:       Head (right side): No submandibular adenopathy present.       Head (left side): No submandibular adenopathy present.    She has no cervical adenopathy.  Neurological: She is alert and oriented to person, place, and time. She has normal strength. No cranial nerve deficit or sensory deficit.  Skin: Skin is warm and dry.  Psychiatric: She has a normal mood and affect. Her speech is normal.  Nursing note and vitals reviewed.    ED Treatments / Results  Labs (all labs ordered are listed, but only abnormal results are displayed) Labs Reviewed - No data to display  EKG  EKG Interpretation None       Radiology No results found.  Procedures Procedures (including critical care time)  Medications Ordered in ED Medications - No data to display   Initial Impression / Assessment and Plan / ED Course  I have reviewed the triage vital signs and the nursing notes.  Pertinent labs & imaging results that were available during my care of the patient were reviewed by me and considered in my medical decision making (see chart for details).       Final Clinical Impressions(s) / ED Diagnoses MDM Patient has had a problem with some upper respiratory is symptoms recently.  She now has pain of the right ear.  She continues to have some nasal congestion present.  The examination is negative for otitis media or otitis externa.  There is no evidence of any mastoiditis.  Patient does have some mild fluid behind the tympanic membrane on the  right.  The patient will use Tylenol every 4 hours or ibuprofen 4 times daily for pain.  She will use decongestant of her choice.  I have asked her to increase fluids.  She is to follow-up with her primary physician or return to the emergency department if any changes, problems, or concerns.   Final diagnoses:  Right ear pain  Upper respiratory tract infection, unspecified type    ED Discharge Orders    None       Ivery QualeBryant, Troyce Gieske, PA-C 08/08/17 2345    Doug SouJacubowitz, Sam, MD 08/09/17 [phone]

## 2017-08-08 NOTE — Discharge Instructions (Addendum)
Your vital signs are within normal limits.  Your exam shows some fluid behind the drum, but no other changes or problems.  Please use the decongestant of your choice, such as Sudafed, Claritin-D, Allegra-D, etc.  Use 600 mg of ibuprofen with breakfast, lunch, dinner, and at bedtime to assist with your discomfort.  Please see your primary physician or return to the emergency department if not improving.

## 2017-08-11 ENCOUNTER — Emergency Department
Admission: EM | Admit: 2017-08-11 | Discharge: 2017-08-11 | Disposition: A | Discharge status: Home / Self Care | Payer: Self-pay | Attending: Emergency Medicine | Admitting: Emergency Medicine

## 2017-08-11 DIAGNOSIS — Y929 Unspecified place or not applicable: Secondary | ICD-10-CM | POA: Insufficient documentation

## 2017-08-11 DIAGNOSIS — Y939 Activity, unspecified: Secondary | ICD-10-CM | POA: Insufficient documentation

## 2017-08-11 DIAGNOSIS — Y999 Unspecified external cause status: Secondary | ICD-10-CM | POA: Insufficient documentation

## 2017-08-11 DIAGNOSIS — W268XXA Contact with other sharp object(s), not elsewhere classified, initial encounter: Secondary | ICD-10-CM | POA: Insufficient documentation

## 2017-08-11 DIAGNOSIS — S61215A Laceration without foreign body of left ring finger without damage to nail, initial encounter: Secondary | ICD-10-CM | POA: Insufficient documentation

## 2017-08-11 DIAGNOSIS — F17210 Nicotine dependence, cigarettes, uncomplicated: Secondary | ICD-10-CM | POA: Insufficient documentation

## 2017-08-11 DIAGNOSIS — Z23 Encounter for immunization: Secondary | ICD-10-CM | POA: Insufficient documentation

## 2017-08-11 MED ORDER — TETANUS-DIPHTH-ACELL PERTUSSIS 5-2.5-18.5 LF-MCG/0.5 IM SUSP
0.5000 mL | Freq: Once | INTRAMUSCULAR | Status: AC
  Administered 2017-08-11: 0.5 mL via INTRAMUSCULAR
  Filled 2017-08-11: qty 0.5

## 2017-08-11 MED ORDER — BACITRACIN ZINC 500 UNIT/GM EX OINT
1.0000 "application " | TOPICAL_OINTMENT | Freq: Two times a day (BID) | CUTANEOUS | Status: DC
  Filled 2017-08-11: qty 0.9

## 2017-08-11 NOTE — ED Notes (Signed)
See triage note  States she was moving and caught a staple to left hand  Small bruised area and puncture wound noticed to 4 th finger

## 2017-08-11 NOTE — ED Triage Notes (Signed)
Patient reports that she cut her fourth finger left hand on a staple that was in a box at approx 1930 yesterday. Patient c/o pain to area.

## 2017-08-11 NOTE — Discharge Instructions (Signed)
Follow up with the primary care provider of your choice or return to the ER for symptoms of concern.

## 2017-08-11 NOTE — ED Provider Notes (Signed)
[facility] Emergency Department Provider Note  ____________________________________________  Time seen: Approximately 7:08 AM  I have reviewed the triage vital signs and the nursing notes.   HISTORY  Chief Complaint Finger Injury   HPI Audrey Reid is a 26 y.o. female who presents to the emergency department for treatment and evaluation of a laceration to the left index finger. She states a staple went into her finger and when she pulled away, it cut her. Injury occurred about 7:30PM yesterday. She states it bled profusely at first, but has since stopped. She is unsure of her last tetanus vaccination.   Past Medical History:  Diagnosis Date  . Chronic back pain   . Chronic shoulder pain     Patient Active Problem List   Diagnosis Date Noted  . Adjustment disorder with depressed mood   . Suicide attempt by drug ingestion (HCC) 12/05/2016  . MDD (major depressive disorder), single episode, severe (HCC) 12/04/2016    Past Surgical History:  Procedure Laterality Date  . CYST EXCISION      Prior to Admission medications   Medication Sig Start Date End Date Taking? Authorizing Provider  hydrOXYzine (ATARAX/VISTARIL) 25 MG tablet Take 1 tablet (25 mg total) by mouth every 6 (six) hours as needed for anxiety. Patient not taking: Reported on 08/08/2017 12/07/16   Adonis BrookAgustin, Sheila, NP  mirtazapine (REMERON) 7.5 MG tablet Take 1 tablet (7.5 mg total) by mouth at bedtime. Patient not taking: Reported on 08/08/2017 12/07/16   Adonis BrookAgustin, Sheila, NP  nicotine (NICODERM CQ - DOSED IN MG/24 HOURS) 14 mg/24hr patch Place 1 patch (14 mg total) onto the skin daily. Patient not taking: Reported on 08/08/2017 12/08/16   Adonis BrookAgustin, Sheila, NP    Allergies Patient has no known allergies.  No family history on file.  Social History Social History   Tobacco Use  . Smoking status: Current Every Day Smoker    Packs/day: 0.50    Years: 2.00    Pack years: 1.00    Types:  Cigarettes    Last attempt to quit: 05/13/2013    Years since quitting: 4.2  . Smokeless tobacco: Never Used  Substance Use Topics  . Alcohol use: Yes    Comment: sometimes   . Drug use: No    Review of Systems  Constitutional: Negative for fever. Respiratory: Negative for cough or shortness of breath.  Musculoskeletal: Negative for myalgias Skin: Positive for laceration Neurological: Negative for numbness or paresthesias. ____________________________________________   PHYSICAL EXAM:  VITAL SIGNS: ED Triage Vitals  Enc Vitals Group     BP 08/11/17 0624 127/88     Pulse Rate 08/11/17 0624 78     Resp 08/11/17 0624 18     Temp 08/11/17 0624 99 F (37.2 C)     Temp Source 08/11/17 0624 Oral     SpO2 08/11/17 0624 100 %     Weight 08/11/17 0621 155 lb (70.3 kg)     Height --      Head Circumference --      Peak Flow --      Pain Score 08/11/17 0621 7     Pain Loc --      Pain Edu? --      Excl. in GC? --      Constitutional: Well appearing. Eyes: Conjunctivae are clear without discharge or drainage. Nose: No rhinorrhea noted. Mouth/Throat: Airway is patent.  Neck: No stridor. Unrestricted range of motion observed.  Cardiovascular: Capillary refill is <3 seconds.  Respiratory: Respirations are even and unlabored.. Musculoskeletal: Unrestricted range of motion observed. Neurologic: Awake, alert, and oriented x 4.  Skin:  3mm superficial laceration with small area of ecchymosis in surrounding area  ____________________________________________   LABS (all labs ordered are listed, but only abnormal results are displayed)  Labs Reviewed - No data to display ____________________________________________  EKG  Not indicated. ____________________________________________  RADIOLOGY  Not indicated. ____________________________________________   PROCEDURES  Procedures ____________________________________________   INITIAL IMPRESSION / ASSESSMENT AND PLAN /  ED COURSE  Audrey Reid is a 26 y.o. female who presents to the emergency department for evaluation of a superficial laceration/puncture wound to the left ring finger. Tetanus immunization was given and sterile antibiotic dressing was applied. Wound care instructions were provided. She is to see PCP or return to the ER for symptoms of concern.   Medications  bacitracin ointment 1 application (not administered)  Tdap (BOOSTRIX) injection 0.5 mL (0.5 mLs Intramuscular Given 08/11/17 0726)     Pertinent labs & imaging results that were available during my care of the patient were reviewed by me and considered in my medical decision making (see chart for details). ____________________________________________   FINAL CLINICAL IMPRESSION(S) / ED DIAGNOSES  Final diagnoses:  Laceration of left ring finger without foreign body without damage to nail, initial encounter    ED Discharge Orders    None       Note:  This document was prepared using Dragon voice recognition software and may include unintentional dictation errors.    Chinita Pesterriplett, Raad Clayson B, FNP 08/11/17 [phone]    Nita SickleVeronese, [place], MD 08/11/17 [phone]

## 2017-12-18 ENCOUNTER — Encounter (HOSPITAL_COMMUNITY): Payer: Self-pay | Admitting: Emergency Medicine

## 2017-12-18 ENCOUNTER — Emergency Department (HOSPITAL_COMMUNITY)
Admission: EM | Admit: 2017-12-18 | Discharge: 2017-12-18 | Disposition: A | Discharge status: Home / Self Care | Payer: Self-pay | Attending: Emergency Medicine | Admitting: Emergency Medicine

## 2017-12-18 DIAGNOSIS — Y929 Unspecified place or not applicable: Secondary | ICD-10-CM | POA: Insufficient documentation

## 2017-12-18 DIAGNOSIS — W57XXXA Bitten or stung by nonvenomous insect and other nonvenomous arthropods, initial encounter: Secondary | ICD-10-CM | POA: Insufficient documentation

## 2017-12-18 DIAGNOSIS — S0086XA Insect bite (nonvenomous) of other part of head, initial encounter: Secondary | ICD-10-CM | POA: Insufficient documentation

## 2017-12-18 DIAGNOSIS — F17210 Nicotine dependence, cigarettes, uncomplicated: Secondary | ICD-10-CM | POA: Insufficient documentation

## 2017-12-18 DIAGNOSIS — S40862A Insect bite (nonvenomous) of left upper arm, initial encounter: Secondary | ICD-10-CM | POA: Insufficient documentation

## 2017-12-18 DIAGNOSIS — Z79899 Other long term (current) drug therapy: Secondary | ICD-10-CM | POA: Insufficient documentation

## 2017-12-18 DIAGNOSIS — Y939 Activity, unspecified: Secondary | ICD-10-CM | POA: Insufficient documentation

## 2017-12-18 DIAGNOSIS — S40861A Insect bite (nonvenomous) of right upper arm, initial encounter: Secondary | ICD-10-CM | POA: Insufficient documentation

## 2017-12-18 DIAGNOSIS — Y999 Unspecified external cause status: Secondary | ICD-10-CM | POA: Insufficient documentation

## 2017-12-18 MED ORDER — PREDNISONE 20 MG PO TABS: 20.0000 mg | ORAL_TABLET | Freq: Every day | ORAL | 0 refills | Status: AC

## 2017-12-18 MED ORDER — HYDROXYZINE HCL 25 MG PO TABS: 25.0000 mg | ORAL_TABLET | Freq: Four times a day (QID) | ORAL | 0 refills | Status: AC | PRN

## 2017-12-18 NOTE — ED Provider Notes (Signed)
Emergency Department Provider Note   I have reviewed the triage vital signs and the nursing notes.   HISTORY  Chief Complaint Bed Bug Bites   HPI Audrey Reid is a 27 y.o. female presents to the emergency department for evaluation of insect bites over the face and arms.  The patient was exposed to bedbugs while staying in a hotel recently.  Since returning home she has not seen any bugs but continues to have itchy rash.  She has had some new bites since returning.  She has washed her clothes and sheets and taken many of her close out of the house.  She plans on calling an exterminator to help her get rid of any lingering bedbugs.  She states that she has had worsening itching and redness which brings her to the emergency department.  No fevers or chills.  No radiation of symptoms or modifying factors.  Past Medical History:  Diagnosis Date  . Chronic back pain   . Chronic shoulder pain     Patient Active Problem List   Diagnosis Date Noted  . Adjustment disorder with depressed mood   . Suicide attempt by drug ingestion (HCC) 12/05/2016  . MDD (major depressive disorder), single episode, severe (HCC) 12/04/2016    Past Surgical History:  Procedure Laterality Date  . CYST EXCISION      Current Outpatient Rx  . Order #: [id number] Class: Print  . Order #: [id number] Class: Print  . Order #: [id number] Class: Print  . Order #: [id number] Class: Print    Allergies Patient has no known allergies.  No family history on file.  Social History Social History   Tobacco Use  . Smoking status: Current Every Day Smoker    Packs/day: 0.50    Years: 2.00    Pack years: 1.00    Types: Cigarettes    Last attempt to quit: 05/13/2013    Years since quitting: 4.6  . Smokeless tobacco: Never Used  Substance Use Topics  . Alcohol use: Yes    Comment: sometimes   . Drug use: No    Review of Systems  Constitutional: No fever/chills Eyes: No visual changes. ENT: No sore  throat. Cardiovascular: Denies chest pain. Respiratory: Denies shortness of breath. Gastrointestinal: No abdominal pain.  No nausea, no vomiting.  No diarrhea.  No constipation. Genitourinary: Negative for dysuria. Musculoskeletal: Negative for back pain. Skin: Positive insect bites. Neurological: Negative for headaches, focal weakness or numbness.  10-point ROS otherwise negative.  ____________________________________________   PHYSICAL EXAM:  VITAL SIGNS: ED Triage Vitals [12/18/17 0624]  Enc Vitals Group     BP 137/86     Pulse Rate 70     Resp 18     Temp 99.1 F (37.3 C)     Temp Source Oral     SpO2 99 %     Weight 160 lb (72.6 kg)     Height 5\' 7"  (1.702 m)     Pain Score 0   Constitutional: Alert and oriented. Well appearing and in no acute distress. Eyes: Conjunctivae are normal. Head: Atraumatic. Nose: No congestion/rhinnorhea. Mouth/Throat: Mucous membranes are moist. Neck: No stridor.  Cardiovascular: Normal rate, regular rhythm. Good peripheral circulation. Grossly normal heart sounds.   Respiratory: Normal respiratory effort.  No retractions. Lungs CTAB. Gastrointestinal: Soft and nontender. No distention.  Musculoskeletal: No lower extremity tenderness nor edema. No gross deformities of extremities. Neurologic:  Normal speech and language. No gross focal neurologic deficits are appreciated.  Skin:  Skin is  warm, dry and intact. Punctate, clustered erythematous rash over the forehead and bilateral forearms. No petechiae.  ____________________________________________   PROCEDURES  Procedure(s) performed:   Procedures  None ____________________________________________   INITIAL IMPRESSION / ASSESSMENT AND PLAN / ED COURSE  Pertinent labs & imaging results that were available during my care of the patient were reviewed by me and considered in my medical decision making (see chart for details).  To the emergency department with itchy rash  consistent with bedbug bites.  She has a picture of one of the insects which is consistent with a bedbug.  Discussed ways to eradicate him from the house and advised also consulting an exterminator.  For her symptoms with some surrounding erythema plan for steroid burst and Atarax for itching.  There is no evidence of cellulitis.  Provided follow-up for PCP locally.    ____________________________________________  FINAL CLINICAL IMPRESSION(S) / ED DIAGNOSES  Final diagnoses:  Insect bite, unspecified site, initial encounter    NEW OUTPATIENT MEDICATIONS STARTED DURING THIS VISIT:  New Prescriptions   HYDROXYZINE (ATARAX/VISTARIL) 25 MG TABLET    Take 1 tablet (25 mg total) by mouth every 6 (six) hours as needed for itching.   PREDNISONE (DELTASONE) 20 MG TABLET    Take 1 tablet (20 mg total) by mouth daily for 5 days.    Note:  This document was prepared using Dragon voice recognition software and may include unintentional dictation errors.  Alona Bene, MD Emergency Medicine    Nahzir Pohle, Arlyss Repress, MD 12/18/17 [phone]

## 2017-12-18 NOTE — ED Triage Notes (Signed)
Pt stayed at a hotel on Saturday and saw a bed bug. Woke up today with itching all over and bites to forehead, neck, and innermost aspect of R arm.

## 2017-12-18 NOTE — Discharge Instructions (Signed)
You were seen in the ED today with insect bites. We are treating your symptoms but the only way to completely get rid of the rash is to get rid of the bugs. Use the medication provided for symptom relief and return to the ED or your PCP with any new or worsening symptoms.

## 2017-12-19 ENCOUNTER — Other Ambulatory Visit: Payer: Self-pay

## 2017-12-19 ENCOUNTER — Encounter (HOSPITAL_COMMUNITY): Payer: Self-pay | Admitting: *Deleted

## 2017-12-19 ENCOUNTER — Emergency Department (HOSPITAL_COMMUNITY)
Admission: EM | Admit: 2017-12-19 | Discharge: 2017-12-19 | Disposition: A | Discharge status: Home / Self Care | Payer: Self-pay | Attending: Emergency Medicine | Admitting: Emergency Medicine

## 2017-12-19 DIAGNOSIS — W57XXXA Bitten or stung by nonvenomous insect and other nonvenomous arthropods, initial encounter: Secondary | ICD-10-CM | POA: Insufficient documentation

## 2017-12-19 DIAGNOSIS — Y939 Activity, unspecified: Secondary | ICD-10-CM | POA: Insufficient documentation

## 2017-12-19 DIAGNOSIS — Y999 Unspecified external cause status: Secondary | ICD-10-CM | POA: Insufficient documentation

## 2017-12-19 DIAGNOSIS — Y929 Unspecified place or not applicable: Secondary | ICD-10-CM | POA: Insufficient documentation

## 2017-12-19 DIAGNOSIS — S00261A Insect bite (nonvenomous) of right eyelid and periocular area, initial encounter: Secondary | ICD-10-CM | POA: Insufficient documentation

## 2017-12-19 DIAGNOSIS — T7840XA Allergy, unspecified, initial encounter: Secondary | ICD-10-CM

## 2017-12-19 MED ORDER — PREDNISONE 50 MG PO TABS
60.0000 mg | ORAL_TABLET | Freq: Once | ORAL | Status: AC
  Administered 2017-12-19: 60 mg via ORAL
  Filled 2017-12-19: qty 1

## 2017-12-19 MED ORDER — HYDROXYZINE HCL 25 MG PO TABS
25.0000 mg | ORAL_TABLET | Freq: Once | ORAL | Status: AC
  Administered 2017-12-19: 25 mg via ORAL
  Filled 2017-12-19: qty 1

## 2017-12-19 MED ORDER — DIPHENHYDRAMINE HCL 25 MG PO TABS: 25.0000 mg | ORAL_TABLET | Freq: Three times a day (TID) | ORAL | 0 refills | Status: AC | PRN

## 2017-12-19 MED ORDER — FAMOTIDINE 20 MG PO TABS
20.0000 mg | ORAL_TABLET | Freq: Two times a day (BID) | ORAL | 0 refills | Status: AC
Start: 2017-12-19 — End: ?

## 2017-12-19 NOTE — Discharge Instructions (Addendum)
Were seen today for swelling of the right eye.  This is likely related to the bite sustained from bedbugs.  You likely have a local reaction.  Take Benadryl as needed.  Start steroids and Atarax as previously prescribed.  You may also start Pepcid daily.  Apply a cool compress.  If you have increasing pain, swelling, redness or any new or worsening symptoms you should be reevaluated.

## 2017-12-19 NOTE — ED Triage Notes (Signed)
Pt c/o itching and swelling to right eye; pt was seen here yesterday and given steroids due to insect bite;

## 2017-12-19 NOTE — ED Provider Notes (Signed)
[facility] EMERGENCY DEPARTMENT Provider Note   CSN: [id number] Arrival date & time: 12/19/17  0118     History   Chief Complaint Chief Complaint  Patient presents with  . Facial Swelling    HPI Audrey Reid is a 27 y.o. female.  HPI  This is a 27 year old female who presents to the emergency department with concern of right eye swelling.  Patient was seen and evaluated approximately yesterday for concerns for bedbugs.  She was prescribed prednisone and Atarax for itching.  She went home and took a dose of Benadryl and slept until 10 PM.  When she woke up she noted swelling over the right eye.  She did not take a dose of prednisone or Atarax yesterday and plans to get the prescriptions filled later today.  She was concerned given the eye swelling.  She denies any vision changes or pain.  She denies any shortness of breath or throat swelling.  Past Medical History:  Diagnosis Date  . Chronic back pain   . Chronic shoulder pain     Patient Active Problem List   Diagnosis Date Noted  . Adjustment disorder with depressed mood   . Suicide attempt by drug ingestion (HCC) 12/05/2016  . MDD (major depressive disorder), single episode, severe (HCC) 12/04/2016    Past Surgical History:  Procedure Laterality Date  . CYST EXCISION       OB History    Gravida      Para      Term      Preterm      AB      Living  0     SAB      TAB      Ectopic      Multiple      Live Births               Home Medications    Prior to Admission medications   Medication Sig Start Date End Date Taking? Authorizing Provider  diphenhydrAMINE (BENADRYL) 25 MG tablet Take 1 tablet (25 mg total) by mouth every 8 (eight) hours as needed. 12/19/17   Horton, Mayer Maskerourtney F, MD  famotidine (PEPCID) 20 MG tablet Take 1 tablet (20 mg total) by mouth 2 (two) times daily. 12/19/17   Horton, Mayer Maskerourtney F, MD  hydrOXYzine (ATARAX/VISTARIL) 25 MG tablet Take 1 tablet (25 mg total) by mouth  every 6 (six) hours as needed for itching. 12/18/17   Long, Arlyss RepressJoshua G, MD  mirtazapine (REMERON) 7.5 MG tablet Take 1 tablet (7.5 mg total) by mouth at bedtime. Patient not taking: Reported on 08/08/2017 12/07/16   Adonis BrookAgustin, Sheila, NP  nicotine (NICODERM CQ - DOSED IN MG/24 HOURS) 14 mg/24hr patch Place 1 patch (14 mg total) onto the skin daily. Patient not taking: Reported on 08/08/2017 12/08/16   Adonis BrookAgustin, Sheila, NP  predniSONE (DELTASONE) 20 MG tablet Take 1 tablet (20 mg total) by mouth daily for 5 days. 12/18/17 12/23/17  Long, Arlyss RepressJoshua G, MD    Family History History reviewed. No pertinent family history.  Social History Social History   Tobacco Use  . Smoking status: Current Every Day Smoker    Packs/day: 0.50    Years: 2.00    Pack years: 1.00    Types: Cigarettes    Last attempt to quit: 05/13/2013    Years since quitting: 4.6  . Smokeless tobacco: Never Used  Substance Use Topics  . Alcohol use: Yes    Comment: sometimes   .  Drug use: No     Allergies   Patient has no known allergies.   Review of Systems Review of Systems  HENT: Negative for trouble swallowing.   Eyes: Positive for redness and itching. Negative for visual disturbance.  Respiratory: Negative for shortness of breath.   Cardiovascular: Negative for chest pain.  Skin: Positive for rash.  All other systems reviewed and are negative.    Physical Exam Updated Vital Signs BP 99/81 (BP Location: Left Arm)   Pulse 79   Temp 97.8 F (36.6 C) (Oral)   Resp 18   Ht 5\' 7"  (1.702 m)   Wt 72.6 kg (160 lb)   LMP 12/18/2017   SpO2 99%   BMI 25.06 kg/m   Physical Exam  Constitutional: She is oriented to person, place, and time. She appears well-developed and well-nourished.  HENT:  Head: Normocephalic and atraumatic.  Mouth/Throat: Oropharynx is clear and moist.  Notable hyperemic bites noted over the forehead  Eyes: Pupils are equal, round, and reactive to light.  Pulse 4 mm reactive bilaterally, slight  swelling noted to the right eyelid with what appears to be a bite just adjacent, normal extraocular movements, no pain with extraocular movements  Cardiovascular: Normal rate and regular rhythm.  Pulmonary/Chest: Effort normal. No respiratory distress.  Neurological: She is alert and oriented to person, place, and time.  Skin: Skin is warm and dry.  Psychiatric: She has a normal mood and affect.  Nursing note and vitals reviewed.    ED Treatments / Results  Labs (all labs ordered are listed, but only abnormal results are displayed) Labs Reviewed - No data to display  EKG None  Radiology No results found.  Procedures Procedures (including critical care time)  Medications Ordered in ED Medications  hydrOXYzine (ATARAX/VISTARIL) tablet 25 mg (has no administration in time range)  predniSONE (DELTASONE) tablet 60 mg (has no administration in time range)     Initial Impression / Assessment and Plan / ED Course  I have reviewed the triage vital signs and the nursing notes.  Pertinent labs & imaging results that were available during my care of the patient were reviewed by me and considered in my medical decision making (see chart for details).     Patient presents with slight swelling of the right eye.  This is in the setting of recent presumed bedbug bites.  She also has a bite over the right eyelid.  Suspect local allergic response.  No signs or symptoms of cellulitis.  Recommend patient get prescriptions filled as previously prescribed.  Additionally recommend Benadryl as needed and Pepcid.  Patient was given 1 dose of prednisone here as well as a dose of Atarax.  Recommend cool compresses.  Patient was given strict return precautions.  After history, exam, and medical workup I feel the patient has been appropriately medically screened and is safe for discharge home. Pertinent diagnoses were discussed with the patient. Patient was given return precautions.   Final Clinical  Impressions(s) / ED Diagnoses   Final diagnoses:  Allergic reaction, initial encounter  Insect bite of right eyelid, initial encounter    ED Discharge Orders        Ordered    famotidine (PEPCID) 20 MG tablet  2 times daily     12/19/17 0159    diphenhydrAMINE (BENADRYL) 25 MG tablet  Every 8 hours PRN     12/19/17 0159       Shon Baton, MD 12/19/17 [phone]

## 2018-04-07 ENCOUNTER — Emergency Department
Admission: EM | Admit: 2018-04-07 | Discharge: 2018-04-07 | Disposition: A | Discharge status: Home / Self Care | Payer: Self-pay | Attending: Emergency Medicine | Admitting: Emergency Medicine

## 2018-04-07 ENCOUNTER — Other Ambulatory Visit: Payer: Self-pay

## 2018-04-07 DIAGNOSIS — Z79899 Other long term (current) drug therapy: Secondary | ICD-10-CM | POA: Insufficient documentation

## 2018-04-07 DIAGNOSIS — H6001 Abscess of right external ear: Secondary | ICD-10-CM | POA: Insufficient documentation

## 2018-04-07 DIAGNOSIS — F17210 Nicotine dependence, cigarettes, uncomplicated: Secondary | ICD-10-CM | POA: Insufficient documentation

## 2018-04-07 DIAGNOSIS — F329 Major depressive disorder, single episode, unspecified: Secondary | ICD-10-CM | POA: Insufficient documentation

## 2018-04-07 DIAGNOSIS — F4321 Adjustment disorder with depressed mood: Secondary | ICD-10-CM | POA: Insufficient documentation

## 2018-04-07 MED ORDER — SULFAMETHOXAZOLE-TRIMETHOPRIM 800-160 MG PO TABS: 1.0000 | ORAL_TABLET | Freq: Two times a day (BID) | ORAL | 0 refills | Status: AC

## 2018-04-07 MED ORDER — TRAMADOL HCL 50 MG PO TABS: 50.0000 mg | ORAL_TABLET | Freq: Two times a day (BID) | ORAL | 0 refills | Status: AC | PRN

## 2018-04-07 MED ORDER — LIDOCAINE HCL (PF) 1 % IJ SOLN
5.0000 mL | Freq: Once | INTRAMUSCULAR | Status: AC
  Administered 2018-04-07: 5 mL
  Filled 2018-04-07: qty 5

## 2018-04-07 MED ORDER — LIDOCAINE-EPINEPHRINE-TETRACAINE (LET) SOLUTION
3.0000 mL | Freq: Once | NASAL | Status: AC
  Administered 2018-04-07: 3 mL via TOPICAL
  Filled 2018-04-07: qty 3

## 2018-04-07 MED ORDER — NEOMYCIN-COLIST-HC-THONZONIUM 3.3-3-10-0.5 MG/ML OT SUSP: 2.0000 [drp] | Freq: Four times a day (QID) | OTIC | 0 refills | Status: AC

## 2018-04-07 NOTE — ED Notes (Signed)
Patient c/o frontal headache for two days. Patient denies nausea, dizziness or blurry vision. Patient does c/o poor appetite.

## 2018-04-07 NOTE — ED Triage Notes (Signed)
Pt c/o right ear pain for the past 2 days, yesterday noticed swelling in the ear canal.

## 2018-04-07 NOTE — ED Notes (Signed)
Per Durward Parcelon Smith, PA-C, LET was applied to ear wick which was gently placed in the distal end of the ear canal. Additional LET was placed on a cotton ball and placed externally in the right ear. Patient was placed on her left side. Patient tolerated procedure well.

## 2018-04-07 NOTE — ED Provider Notes (Signed)
[facility] Emergency Department Provider Note   ____________________________________________   First MD Initiated Contact with Patient 04/07/18 1331     (approximate)  I have reviewed the triage vital signs and the nursing notes.   HISTORY  Chief Complaint Otalgia    HPI Audrey Reid is a 27 y.o. female patient complaining of right ear pain and edema for 2 days.  Patient also state decreased hearing loss secondary to the edema.  Patient denies fever.  Patient decreased hearing from the right ear.  Patient rates the pain as a 10/10.  Patient described the pain is "aching".  No palliative measure for complaint.  Patient denies any provocative incident for complaint.  Past Medical History:  Diagnosis Date  . Chronic back pain   . Chronic shoulder pain     Patient Active Problem List   Diagnosis Date Noted  . Adjustment disorder with depressed mood   . Suicide attempt by drug ingestion (HCC) 12/05/2016  . MDD (major depressive disorder), single episode, severe (HCC) 12/04/2016    Past Surgical History:  Procedure Laterality Date  . CYST EXCISION      Prior to Admission medications   Medication Sig Start Date End Date Taking? Authorizing Provider  diphenhydrAMINE (BENADRYL) 25 MG tablet Take 1 tablet (25 mg total) by mouth every 8 (eight) hours as needed. 12/19/17   Horton, Mayer Masker, MD  famotidine (PEPCID) 20 MG tablet Take 1 tablet (20 mg total) by mouth 2 (two) times daily. 12/19/17   Horton, Mayer Masker, MD  hydrOXYzine (ATARAX/VISTARIL) 25 MG tablet Take 1 tablet (25 mg total) by mouth every 6 (six) hours as needed for itching. 12/18/17   Long, Arlyss Repress, MD  mirtazapine (REMERON) 7.5 MG tablet Take 1 tablet (7.5 mg total) by mouth at bedtime. Patient not taking: Reported on 08/08/2017 12/07/16   Adonis Brook, NP  neomycin-colistin-hydrocortisone-thonzonium (COLY-MYCIN S) 3.11-05-08-0.5 MG/ML OTIC suspension Place 2 drops into the right ear 4  (four) times daily for 10 days. 04/07/18 04/17/18  Joni Reining, PA-C  nicotine (NICODERM CQ - DOSED IN MG/24 HOURS) 14 mg/24hr patch Place 1 patch (14 mg total) onto the skin daily. Patient not taking: Reported on 08/08/2017 12/08/16   Adonis Brook, NP  sulfamethoxazole-trimethoprim (BACTRIM DS,SEPTRA DS) 800-160 MG tablet Take 1 tablet by mouth 2 (two) times daily. 04/07/18   Joni Reining, PA-C  traMADol (ULTRAM) 50 MG tablet Take 1 tablet (50 mg total) by mouth every 12 (twelve) hours as needed. 04/07/18   Joni Reining, PA-C    Allergies Patient has no known allergies.  No family history on file.  Social History Social History   Tobacco Use  . Smoking status: Current Every Day Smoker    Packs/day: 0.50    Years: 2.00    Pack years: 1.00    Types: Cigarettes    Last attempt to quit: 05/13/2013    Years since quitting: 4.9  . Smokeless tobacco: Never Used  Substance Use Topics  . Alcohol use: Yes    Comment: sometimes   . Drug use: No    Review of Systems Constitutional: No fever/chills Eyes: No visual changes. ENT: No sore throat. Cardiovascular: Denies chest pain. Respiratory: Denies shortness of breath. Gastrointestinal: No abdominal pain.  No nausea, no vomiting.  No diarrhea.  No constipation. Genitourinary: Negative for dysuria. Musculoskeletal: Negative for back pain. Skin: Negative for rash.  Edematous tragus right ear. Neurological: Negative for headaches, focal weakness or numbness. ____________________________________________  PHYSICAL EXAM:  VITAL SIGNS: ED Triage Vitals  Enc Vitals Group     BP 04/07/18 1318 123/83     Pulse Rate 04/07/18 1318 94     Resp 04/07/18 1318 17     Temp 04/07/18 1318 98.4 F (36.9 C)     Temp Source 04/07/18 1318 Oral     SpO2 04/07/18 1318 100 %     Weight 04/07/18 1319 160 lb (72.6 kg)     Height 04/07/18 1319 5\' 7"  (1.702 m)     Head Circumference --      Peak Flow --      Pain Score 04/07/18 1319 10     Pain  Loc --      Pain Edu? --      Excl. in GC? --    Constitutional: Alert and oriented. Well appearing and in no acute distress. EARS: Edematous erythematous right tragus. Neck: No stridor.  No cervical lymphadenopathy. Cardiovascular: Normal rate, regular rhythm. Grossly normal heart sounds.  Good peripheral circulation. Respiratory: Normal respiratory effort.  No retractions. Lungs CTAB. Neurologic:  Normal speech and language. No gross focal neurologic deficits are appreciated. No gait instability. Skin:  Skin is warm, dry and intact. No rash noted.  Abscess right tragus. Psychiatric: Mood and affect are normal. Speech and behavior are normal.  ____________________________________________   LABS (all labs ordered are listed, but only abnormal results are displayed)  Labs Reviewed - No data to display ____________________________________________  EKG   ____________________________________________  RADIOLOGY  ED MD interpretation:    Official radiology report(s): No results found.  ____________________________________________   PROCEDURES  Procedure(s) performed: None  .Marland Kitchen.Incision and Drainage Date/Time: 04/07/2018 2:50 PM Performed by: Joni ReiningSmith, Mckynzi Cammon K, PA-C Authorized by: Joni ReiningSmith, Donta Fuster K, PA-C   Consent:    Consent obtained:  Verbal   Consent given by:  Patient   Risks discussed:  Bleeding, incomplete drainage, pain and infection Location:    Type:  Abscess   Location:  Head   Head location:  R external auditory canal Pre-procedure details:    Skin preparation:  Betadine Anesthesia (see MAR for exact dosages):    Anesthesia method:  Topical application and local infiltration   Topical anesthetic:  LET   Local anesthetic:  Lidocaine 1% w/o epi Procedure type:    Complexity:  Simple Procedure details:    Incision types:  Stab incision   Incision depth:  Dermal   Scalpel blade:  11   Wound management:  Probed and deloculated   Drainage:  Purulent   Drainage  amount:  Scant   Packing materials:  None Post-procedure details:    Patient tolerance of procedure:  Tolerated well, no immediate complications    Critical Care performed: No  ____________________________________________   INITIAL IMPRESSION / ASSESSMENT AND PLAN / ED COURSE  As part of my medical decision making, I reviewed the following data within the electronic MEDICAL RECORD NUMBER    Abscess right tragus.  Area was incised and drained.  Patient given discharge care instruction advised take medication as directed.  Patient advised to my ED if condition worsens.      ____________________________________________   FINAL CLINICAL IMPRESSION(S) / ED DIAGNOSES  Final diagnoses:  Abscess of tragus of right ear     ED Discharge Orders        Ordered    sulfamethoxazole-trimethoprim (BACTRIM DS,SEPTRA DS) 800-160 MG tablet  2 times daily     04/07/18 1448    neomycin-colistin-hydrocortisone-thonzonium (COLY-MYCIN S) 3.11-05-08-0.5  MG/ML OTIC suspension  4 times daily     04/07/18 1448    traMADol (ULTRAM) 50 MG tablet  Every 12 hours PRN     04/07/18 1448       Note:  This document was prepared using Dragon voice recognition software and may include unintentional dictation errors.    Joni Reining, PA-C 04/07/18 1456    Sharyn Creamer, MD 04/07/18 1515

## 2018-04-07 NOTE — Discharge Instructions (Signed)
Follow discharge care instruction take medication as directed. °

## 2020-09-20 ENCOUNTER — Ambulatory Visit: Payer: Self-pay

## 2023-01-05 ENCOUNTER — Encounter (HOSPITAL_BASED_OUTPATIENT_CLINIC_OR_DEPARTMENT_OTHER): Payer: Self-pay | Admitting: Emergency Medicine

## 2023-01-05 ENCOUNTER — Other Ambulatory Visit: Payer: Self-pay

## 2023-01-05 ENCOUNTER — Emergency Department (HOSPITAL_BASED_OUTPATIENT_CLINIC_OR_DEPARTMENT_OTHER)
Admission: EM | Admit: 2023-01-05 | Discharge: 2023-01-05 | Disposition: A | Discharge status: Home / Self Care | Payer: Self-pay | Attending: Emergency Medicine | Admitting: Emergency Medicine

## 2023-01-05 DIAGNOSIS — J069 Acute upper respiratory infection, unspecified: Secondary | ICD-10-CM | POA: Insufficient documentation

## 2023-01-05 DIAGNOSIS — Z1152 Encounter for screening for COVID-19: Secondary | ICD-10-CM | POA: Insufficient documentation

## 2023-01-05 LAB — SARS CORONAVIRUS 2 BY RT PCR: SARS Coronavirus 2 by RT PCR: NEGATIVE

## 2023-01-05 NOTE — ED Provider Notes (Signed)
[place] EMERGENCY DEPARTMENT AT [facility] Provider Note   CSN: [id number] Arrival date & time: 01/05/23  1128     History  Chief Complaint  Patient presents with   Cough    Audrey Reid is a 32 y.o. female.  Patient is a 32 year old female who presents with cough and congestion with fever.  She has had a 3-day history of runny nose congestion coughing with myalgias and subjective fevers/chills.  She has been using some Mucinex and says that her congestion is cleared up but she still has some right ear pain and coughing.       Home Medications Prior to Admission medications   Medication Sig Start Date End Date Taking? Authorizing Provider  diphenhydrAMINE (BENADRYL) 25 MG tablet Take 1 tablet (25 mg total) by mouth every 8 (eight) hours as needed. 12/19/17   Horton, Mayer Masker, MD  famotidine (PEPCID) 20 MG tablet Take 1 tablet (20 mg total) by mouth 2 (two) times daily. 12/19/17   Horton, Mayer Masker, MD  hydrOXYzine (ATARAX/VISTARIL) 25 MG tablet Take 1 tablet (25 mg total) by mouth every 6 (six) hours as needed for itching. 12/18/17   Long, Arlyss Repress, MD  mirtazapine (REMERON) 7.5 MG tablet Take 1 tablet (7.5 mg total) by mouth at bedtime. Patient not taking: Reported on 08/08/2017 12/07/16   Adonis Brook, NP  nicotine (NICODERM CQ - DOSED IN MG/24 HOURS) 14 mg/24hr patch Place 1 patch (14 mg total) onto the skin daily. Patient not taking: Reported on 08/08/2017 12/08/16   Adonis Brook, NP  sulfamethoxazole-trimethoprim (BACTRIM DS,SEPTRA DS) 800-160 MG tablet Take 1 tablet by mouth 2 (two) times daily. 04/07/18   Joni Reining, PA-C  traMADol (ULTRAM) 50 MG tablet Take 1 tablet (50 mg total) by mouth every 12 (twelve) hours as needed. 04/07/18   Joni Reining, PA-C      Allergies    Patient has no known allergies.    Review of Systems   Review of Systems  Constitutional:  Positive for chills, fatigue and fever. Negative for diaphoresis.  HENT:  Positive for  congestion and rhinorrhea. Negative for sneezing.   Eyes: Negative.   Respiratory:  Positive for cough. Negative for chest tightness and shortness of breath.   Cardiovascular:  Negative for chest pain and leg swelling.  Gastrointestinal:  Positive for diarrhea. Negative for abdominal pain, blood in stool, nausea and vomiting.  Genitourinary:  Negative for difficulty urinating and frequency.  Musculoskeletal:  Positive for myalgias. Negative for arthralgias.  Skin:  Negative for rash.  Neurological:  Negative for speech difficulty and headaches.    Physical Exam Updated Vital Signs BP 118/62 (BP Location: Right Arm)   Pulse 72   Temp 99.2 F (37.3 C) (Oral)   Resp 17   Ht 5\' 7"  (1.702 m)   Wt 82.6 kg   LMP 12/07/2022 (Exact Date)   SpO2 100%   BMI 28.51 kg/m  Physical Exam Constitutional:      Appearance: She is well-developed.  HENT:     Head: Normocephalic and atraumatic.     Right Ear: Tympanic membrane normal.     Left Ear: Tympanic membrane normal.     Ears:     Comments: Clear fluid behind the TMs but no suggestions of infection    Mouth/Throat:     Mouth: Mucous membranes are moist.     Pharynx: No oropharyngeal exudate or posterior oropharyngeal erythema.  Eyes:     Pupils: Pupils are  equal, round, and reactive to light.  Cardiovascular:     Rate and Rhythm: Normal rate and regular rhythm.     Heart sounds: Normal heart sounds.  Pulmonary:     Effort: Pulmonary effort is normal. No respiratory distress.     Breath sounds: Normal breath sounds. No wheezing or rales.  Chest:     Chest wall: No tenderness.  Abdominal:     General: Bowel sounds are normal.     Palpations: Abdomen is soft.     Tenderness: There is no abdominal tenderness. There is no guarding or rebound.  Musculoskeletal:        General: Normal range of motion.     Cervical back: Normal range of motion and neck supple.  Lymphadenopathy:     Cervical: No cervical adenopathy.  Skin:    General:  Skin is warm and dry.     Findings: No rash.  Neurological:     Mental Status: She is alert and oriented to person, place, and time.     ED Results / Procedures / Treatments   Labs (all labs ordered are listed, but only abnormal results are displayed) Labs Reviewed  SARS CORONAVIRUS 2 BY RT PCR    EKG None  Radiology No results found.  Procedures Procedures    Medications Ordered in ED Medications - No data to display  ED Course/ Medical Decision Making/ A&P                             Medical Decision Making  Patient presents with viral URI symptoms.  Her lungs are clear without suggestions of pneumonia.  She has no hypoxia.  No tachypnea or increased work of breathing.  No suggestions of dehydration.  Otherwise is well-appearing.  She did have a COVID test which is currently pending.  She will follow this up on MyChart.  Discussed doing chest x-ray but she is opting out at this point.  Doubt pneumonia.  She is otherwise well-appearing and appropriate for discharge.  She was discharged home in good condition.  Symptomatic care instructions and return precautions were given.  Final Clinical Impression(s) / ED Diagnoses Final diagnoses:  Viral URI with cough    Rx / DC Orders ED Discharge Orders     None         Rolan Bucco, MD 01/05/23 1214

## 2023-01-05 NOTE — ED Triage Notes (Signed)
Pt reports cough, chest congestion, right ear pain, and chills since Monday

## 2023-04-10 ENCOUNTER — Other Ambulatory Visit: Payer: Self-pay

## 2023-04-10 ENCOUNTER — Emergency Department (HOSPITAL_BASED_OUTPATIENT_CLINIC_OR_DEPARTMENT_OTHER)
Admission: EM | Admit: 2023-04-10 | Discharge: 2023-04-10 | Disposition: A | Discharge status: Home / Self Care | Payer: Self-pay | Attending: Emergency Medicine | Admitting: Emergency Medicine

## 2023-04-10 DIAGNOSIS — S51011A Laceration without foreign body of right elbow, initial encounter: Secondary | ICD-10-CM | POA: Insufficient documentation

## 2023-04-10 DIAGNOSIS — W311XXA Contact with metalworking machines, initial encounter: Secondary | ICD-10-CM | POA: Insufficient documentation

## 2023-04-10 DIAGNOSIS — Z23 Encounter for immunization: Secondary | ICD-10-CM | POA: Insufficient documentation

## 2023-04-10 DIAGNOSIS — Y990 Civilian activity done for income or pay: Secondary | ICD-10-CM | POA: Insufficient documentation

## 2023-04-10 DIAGNOSIS — S41111A Laceration without foreign body of right upper arm, initial encounter: Secondary | ICD-10-CM

## 2023-04-10 MED ORDER — LIDOCAINE HCL (PF) 1 % IJ SOLN
30.0000 mL | Freq: Once | INTRAMUSCULAR | Status: AC
  Administered 2023-04-10: 5 mL
  Filled 2023-04-10: qty 30

## 2023-04-10 MED ORDER — TETANUS-DIPHTH-ACELL PERTUSSIS 5-2.5-18.5 LF-MCG/0.5 IM SUSY
0.5000 mL | PREFILLED_SYRINGE | Freq: Once | INTRAMUSCULAR | Status: AC
  Administered 2023-04-10: 0.5 mL via INTRAMUSCULAR
  Filled 2023-04-10: qty 0.5

## 2023-04-10 NOTE — ED Triage Notes (Signed)
Patient presents to ED via POV from work. Patient worked for [employer] Patient accidentally cut herself on someone's rusty metal railing. Laceration to right elbow. Dressing placed. No active bleeding.

## 2023-04-10 NOTE — ED Provider Notes (Addendum)
[facility] Provider Note   CSN: [id number] Arrival date & time: 04/10/23  1831     History Chief Complaint  Patient presents with   Laceration    HPI Jeralynn Wyszynski Chapel is a 32 y.o. female presenting for chief complaint of laceration.  States that she was working at [employer] today when this morning at 8:30 AM she had a cut on her right elbow from a rusty railing.  Denies fevers chills nausea vomiting syncope shortness of breath..   Patient's recorded medical, surgical, social, medication list and allergies were reviewed in the Snapshot window as part of the initial history.   Review of Systems   Review of Systems  Constitutional:  Negative for chills and fever.  HENT:  Negative for ear pain and sore throat.   Eyes:  Negative for pain and visual disturbance.  Respiratory:  Negative for cough and shortness of breath.   Cardiovascular:  Negative for chest pain and palpitations.  Gastrointestinal:  Negative for abdominal pain and vomiting.  Genitourinary:  Negative for dysuria and hematuria.  Musculoskeletal:  Negative for arthralgias and back pain.  Skin:  Negative for color change and rash.  Neurological:  Negative for seizures and syncope.  All other systems reviewed and are negative.   Physical Exam Updated Vital Signs BP (!) 149/120 (BP Location: Left Arm)   Pulse 81   Temp 98.5 F (36.9 C)   Resp 18   Ht 5\' 7"  (1.702 m)   Wt 86.2 kg   SpO2 100%   BMI 29.76 kg/m  Physical Exam Vitals and nursing note reviewed.  Constitutional:      General: She is not in acute distress.    Appearance: She is well-developed.  HENT:     Head: Normocephalic and atraumatic.  Eyes:     Conjunctiva/sclera: Conjunctivae normal.  Cardiovascular:     Rate and Rhythm: Normal rate and regular rhythm.     Heart sounds: No murmur heard. Pulmonary:     Effort: Pulmonary effort is normal. No respiratory distress.     Breath sounds: Normal breath  sounds.  Abdominal:     General: There is no distension.     Palpations: Abdomen is soft.     Tenderness: There is no abdominal tenderness. There is no right CVA tenderness or left CVA tenderness.  Musculoskeletal:        General: Signs of injury (1.5 cm curvilinear laceration to right elbow.) present. No swelling or tenderness. Normal range of motion.     Cervical back: Neck supple.  Skin:    General: Skin is warm and dry.  Neurological:     General: No focal deficit present.     Mental Status: She is alert and oriented to person, place, and time. Mental status is at baseline.     Cranial Nerves: No cranial nerve deficit.      ED Course/ Medical Decision Making/ A&P    Procedures .Marland KitchenLaceration Repair  Date/Time: 04/10/2023 8:14 PM  Performed by: Glyn Ade, MD Authorized by: Glyn Ade, MD   Consent:    Consent obtained:  Verbal   Consent given by:  Patient   Risks, benefits, and alternatives were discussed: yes     Risks discussed:  Infection, need for additional repair, nerve damage, pain, retained foreign body, tendon damage and vascular damage Anesthesia:    Anesthesia method:  None Laceration details:    Location:  Shoulder/arm   Shoulder/arm location:  R elbow  Length (cm):  1.5 Repair type:    Repair type:  Simple Post-procedure details:    Dressing:  Bulky dressing   Procedure completion:  Tolerated    Medications Ordered in ED Medications  lidocaine (PF) (XYLOCAINE) 1 % injection 30 mL (5 mLs Other Given 04/10/23 1959)  Tdap (BOOSTRIX) injection 0.5 mL (0.5 mLs Intramuscular Given 04/10/23 2007)   Medical Decision Making:   Jenny Murdick Fissel is a 32 y.o. female who presented to the ED today with a 1.5 cm laceration to their right elbow. They are neurovascularly intact.  Reviewed and confirmed nursing documentation for past medical history, family history, social history.     Assessment:   Laceration that will require repair.?  Given mechanism  of injury, underlying fracture, deformity, wound site infection and systemic injury were all considered inconsistent with this current history of present illness and physical exam.  Plan:  Lac repair as noted in procedures Tetanus updated  Wound care with standard precautions  and antibiotic ointment   Radiology- Images reviewed personally and agree with radiology report  No results found.   Disposition:  I have considered need for hospitalization, however, considering all of the above, I believe this patient is stable for discharge at this time.  Patient/family educated about specific return precautions for given chief complaint and symptoms.  Patient/family educated about follow-up with PCP .     Patient/family expressed understanding of return precautions and need for follow-up. Patient spoken to regarding all imaging and laboratory results and appropriate follow up for these results. All education provided in verbal form with additional information in written form. Time was allowed for answering of patient questions. Patient discharged.    Emergency Department Medication Summary:   Medications  lidocaine (PF) (XYLOCAINE) 1 % injection 30 mL (5 mLs Other Given 04/10/23 1959)  Tdap (BOOSTRIX) injection 0.5 mL (0.5 mLs Intramuscular Given 04/10/23 2007)               Clinical Impression:  1. Laceration of right upper extremity, initial encounter      Discharge   Final Clinical Impression(s) / ED Diagnoses Final diagnoses:  Laceration of right upper extremity, initial encounter    Rx / DC Orders ED Discharge Orders     None         Glyn Ade, MD 04/10/23 2015   Addendum: I was messaged by the billing service that I did not include the suture type inside of the laceration repair. My recollection is that I placed four Ethilon sutures.    Glyn Ade, MD 06/28/23 1718

## 2023-11-25 ENCOUNTER — Encounter (HOSPITAL_BASED_OUTPATIENT_CLINIC_OR_DEPARTMENT_OTHER): Payer: Self-pay | Admitting: Urology

## 2023-11-25 ENCOUNTER — Emergency Department (HOSPITAL_BASED_OUTPATIENT_CLINIC_OR_DEPARTMENT_OTHER)
Admission: EM | Admit: 2023-11-25 | Discharge: 2023-11-25 | Disposition: A | Discharge status: Home / Self Care | Payer: Self-pay | Attending: Emergency Medicine | Admitting: Emergency Medicine

## 2023-11-25 DIAGNOSIS — J039 Acute tonsillitis, unspecified: Secondary | ICD-10-CM | POA: Insufficient documentation

## 2023-11-25 LAB — RESP PANEL BY RT-PCR (RSV, FLU A&B, COVID)  RVPGX2
Influenza A by PCR: NEGATIVE
Influenza B by PCR: NEGATIVE
Resp Syncytial Virus by PCR: NEGATIVE
SARS Coronavirus 2 by RT PCR: NEGATIVE

## 2023-11-25 LAB — GROUP A STREP BY PCR: Group A Strep by PCR: NOT DETECTED

## 2023-11-25 MED ORDER — AMOXICILLIN 500 MG PO CAPS: 500.0000 mg | ORAL_CAPSULE | Freq: Two times a day (BID) | ORAL | 0 refills | Status: DC

## 2023-11-25 MED ORDER — AMOXICILLIN 500 MG PO CAPS: 500.0000 mg | ORAL_CAPSULE | Freq: Two times a day (BID) | ORAL | 0 refills | Status: AC

## 2023-11-25 MED ORDER — ACETAMINOPHEN 325 MG PO TABS
650.0000 mg | ORAL_TABLET | Freq: Once | ORAL | Status: AC
  Administered 2023-11-25: 650 mg via ORAL
  Filled 2023-11-25: qty 2

## 2023-11-25 MED ORDER — LIDOCAINE VISCOUS HCL 2 % MT SOLN
15.0000 mL | Freq: Once | OROMUCOSAL | Status: AC
  Administered 2023-11-25: 15 mL via OROMUCOSAL
  Filled 2023-11-25: qty 15

## 2023-11-25 NOTE — ED Triage Notes (Signed)
 Pt states sore throat and fever x 6 days

## 2023-11-25 NOTE — Discharge Instructions (Addendum)
 You have tonsillitis, I prescribed you some antibiotic, to help with your pain, as well as please follow-up with your primary care doctor.  Please return to the ER if you have difficulty swallowing, breathing, or have worsening swelling.

## 2023-11-25 NOTE — ED Provider Notes (Signed)
 [facility] Provider Note   CSN: [id number] Arrival date & time: 11/25/23  1105     History  Chief Complaint  Patient presents with   Sore Throat    Audrey Reid is a 33 y.o. female, no pertinent past medical history, who presents to the ED secondary to sore throat that is been going for last 6 days.  She states it feels like glass, when she swallows, and that she has little white spots on her tonsils.  She has not been around anyone sick.  Denies any nasal congestion, cough, shortness of breath, or bodyaches.  She states that she had a fever couple days ago, that was subjective.  Home Medications Prior to Admission medications   Medication Sig Start Date End Date Taking? Authorizing Provider  amoxicillin (AMOXIL) 500 MG capsule Take 1 capsule (500 mg total) by mouth 2 (two) times daily for 10 days. 11/25/23 12/05/23 Yes Farran Amsden L, PA  diphenhydrAMINE (BENADRYL) 25 MG tablet Take 1 tablet (25 mg total) by mouth every 8 (eight) hours as needed. 12/19/17   Horton, Mayer Masker, MD  famotidine (PEPCID) 20 MG tablet Take 1 tablet (20 mg total) by mouth 2 (two) times daily. 12/19/17   Horton, Mayer Masker, MD  hydrOXYzine (ATARAX/VISTARIL) 25 MG tablet Take 1 tablet (25 mg total) by mouth every 6 (six) hours as needed for itching. 12/18/17   Long, Arlyss Repress, MD  mirtazapine (REMERON) 7.5 MG tablet Take 1 tablet (7.5 mg total) by mouth at bedtime. Patient not taking: Reported on 08/08/2017 12/07/16   Adonis Brook, NP  nicotine (NICODERM CQ - DOSED IN MG/24 HOURS) 14 mg/24hr patch Place 1 patch (14 mg total) onto the skin daily. Patient not taking: Reported on 08/08/2017 12/08/16   Adonis Brook, NP  sulfamethoxazole-trimethoprim (BACTRIM DS,SEPTRA DS) 800-160 MG tablet Take 1 tablet by mouth 2 (two) times daily. 04/07/18   Joni Reining, PA-C  traMADol (ULTRAM) 50 MG tablet Take 1 tablet (50 mg total) by mouth every 12 (twelve) hours as needed.  04/07/18   Joni Reining, PA-C      Allergies    Patient has no known allergies.    Review of Systems   Review of Systems  Constitutional:  Positive for fever. Negative for fatigue.  HENT:  Positive for sore throat.     Physical Exam Updated Vital Signs BP (!) 142/104 (BP Location: Right Arm)   Pulse 92   Temp 98.1 F (36.7 C) (Oral)   Resp 18   Ht 5\' 7"  (1.702 m)   Wt 86.2 kg   LMP 11/21/2023   SpO2 100%   BMI 29.76 kg/m  Physical Exam Vitals and nursing note reviewed.  Constitutional:      General: She is not in acute distress.    Appearance: She is well-developed.  HENT:     Head: Normocephalic and atraumatic.     Right Ear: Tympanic membrane normal.     Left Ear: Tympanic membrane normal.     Nose: No congestion.     Mouth/Throat:     Mouth: Mucous membranes are moist.     Pharynx: Oropharyngeal exudate and posterior oropharyngeal erythema present.  Eyes:     Conjunctiva/sclera: Conjunctivae normal.  Cardiovascular:     Rate and Rhythm: Normal rate and regular rhythm.     Heart sounds: No murmur heard. Pulmonary:     Effort: Pulmonary effort is normal. No respiratory distress.  Breath sounds: Normal breath sounds.  Abdominal:     Palpations: Abdomen is soft.     Tenderness: There is no abdominal tenderness.  Musculoskeletal:        General: No swelling.     Cervical back: Neck supple.  Skin:    General: Skin is warm and dry.     Capillary Refill: Capillary refill takes less than 2 seconds.  Neurological:     Mental Status: She is alert.  Psychiatric:        Mood and Affect: Mood normal.     ED Results / Procedures / Treatments   Labs (all labs ordered are listed, but only abnormal results are displayed) Labs Reviewed  RESP PANEL BY RT-PCR (RSV, FLU A&B, COVID)  [id number]  GROUP A STREP BY PCR    EKG None  Radiology No results found.  Procedures Procedures    Medications Ordered in ED Medications  acetaminophen (TYLENOL) tablet 650  mg (650 mg Oral Given 11/25/23 1132)  lidocaine (XYLOCAINE) 2 % viscous mouth solution 15 mL (15 mLs Mouth/Throat Given 11/25/23 1132)    ED Course/ Medical Decision Making/ A&P                                 Medical Decision Making Patient is a 33 year old female, here for sore throat that is been going on for the last 6 days.  She has exudate in her tonsils, versus tonsil stones?,  We will strep test her, and obtain COVID/flu  Amount and/or Complexity of Data Reviewed Labs:     Details: Negative for strep, and COVID/flu Discussion of management or test interpretation with external provider(s): Patient has been tonsillitis for the last 5 to 6 days, has not been having fevers, strep and COVID/flu negative.  Given persistence, will treat with amoxicillin, and have her follow-up with PCP.  She overall has no uvular deviation, no evidence of oropharyngeal masses, and was instructed to return if she had difficulty breathing, or worsening swelling  Risk OTC drugs. Prescription drug management.    Final Clinical Impression(s) / ED Diagnoses Final diagnoses:  Tonsillitis    Rx / DC Orders ED Discharge Orders          Ordered    amoxicillin (AMOXIL) 500 MG capsule  2 times daily        11/25/23 1207              Marleen Moret Elbert Ewings, PA 11/25/23 1222    Virgina Norfolk, DO 11/25/23 1230
